# Patient Record
Sex: Female | Born: 1935 | Race: White | Hispanic: No | Marital: Married | Smoking: Former smoker
Health system: Southern US, Community
[De-identification: ages and names within clinical notes are randomized; demographics above are authoritative.]

## PROBLEM LIST (undated history)

## (undated) DIAGNOSIS — I10 Essential (primary) hypertension: Secondary | ICD-10-CM

## (undated) DIAGNOSIS — E785 Hyperlipidemia, unspecified: Secondary | ICD-10-CM

## (undated) DIAGNOSIS — K635 Polyp of colon: Secondary | ICD-10-CM

## (undated) DIAGNOSIS — G4700 Insomnia, unspecified: Secondary | ICD-10-CM

## (undated) DIAGNOSIS — E039 Hypothyroidism, unspecified: Secondary | ICD-10-CM

## (undated) DIAGNOSIS — H9190 Unspecified hearing loss, unspecified ear: Secondary | ICD-10-CM

## (undated) DIAGNOSIS — F0390 Unspecified dementia without behavioral disturbance: Secondary | ICD-10-CM

## (undated) HISTORY — DX: Hypothyroidism, unspecified: E03.9

## (undated) HISTORY — DX: Polyp of colon: K63.5

## (undated) HISTORY — DX: Essential (primary) hypertension: I10

## (undated) HISTORY — DX: Unspecified hearing loss, unspecified ear: H91.90

## (undated) HISTORY — DX: Insomnia, unspecified: G47.00

## (undated) HISTORY — DX: Unspecified dementia, unspecified severity, without behavioral disturbance, psychotic disturbance, mood disturbance, and anxiety: F03.90

## (undated) HISTORY — DX: Hyperlipidemia, unspecified: E78.5

## (undated) HISTORY — PX: ANKLE FRACTURE SURGERY: SHX122

---

## 2004-05-06 ENCOUNTER — Ambulatory Visit: Payer: Self-pay | Admitting: Internal Medicine

## 2004-05-08 ENCOUNTER — Ambulatory Visit: Payer: Self-pay | Admitting: Family Medicine

## 2004-05-08 ENCOUNTER — Encounter: Payer: Self-pay | Admitting: Internal Medicine

## 2004-06-04 ENCOUNTER — Ambulatory Visit (HOSPITAL_COMMUNITY): Admission: RE | Admit: 2004-06-04 | Discharge: 2004-06-04 | Payer: Self-pay | Admitting: Internal Medicine

## 2004-06-17 ENCOUNTER — Ambulatory Visit: Payer: Self-pay | Admitting: Internal Medicine

## 2004-12-15 ENCOUNTER — Ambulatory Visit: Payer: Self-pay | Admitting: Internal Medicine

## 2005-05-11 ENCOUNTER — Ambulatory Visit: Payer: Self-pay | Admitting: Internal Medicine

## 2005-05-11 ENCOUNTER — Encounter: Payer: Self-pay | Admitting: Internal Medicine

## 2005-05-11 ENCOUNTER — Other Ambulatory Visit: Admission: RE | Admit: 2005-05-11 | Discharge: 2005-05-11 | Payer: Self-pay | Admitting: Internal Medicine

## 2005-06-22 ENCOUNTER — Ambulatory Visit: Payer: Self-pay | Admitting: Internal Medicine

## 2005-06-24 ENCOUNTER — Ambulatory Visit: Payer: Self-pay | Admitting: Internal Medicine

## 2005-07-24 ENCOUNTER — Ambulatory Visit (HOSPITAL_COMMUNITY): Admission: RE | Admit: 2005-07-24 | Discharge: 2005-07-24 | Payer: Self-pay | Admitting: Internal Medicine

## 2005-08-20 ENCOUNTER — Ambulatory Visit: Payer: Self-pay | Admitting: Internal Medicine

## 2006-02-23 ENCOUNTER — Ambulatory Visit: Payer: Self-pay | Admitting: Internal Medicine

## 2006-02-23 DIAGNOSIS — M810 Age-related osteoporosis without current pathological fracture: Secondary | ICD-10-CM | POA: Insufficient documentation

## 2006-02-23 DIAGNOSIS — E039 Hypothyroidism, unspecified: Secondary | ICD-10-CM | POA: Insufficient documentation

## 2006-02-23 DIAGNOSIS — E785 Hyperlipidemia, unspecified: Secondary | ICD-10-CM | POA: Insufficient documentation

## 2006-02-23 DIAGNOSIS — Z860100 Personal history of colon polyps, unspecified: Secondary | ICD-10-CM | POA: Insufficient documentation

## 2006-02-23 DIAGNOSIS — I10 Essential (primary) hypertension: Secondary | ICD-10-CM | POA: Insufficient documentation

## 2006-02-23 LAB — CONVERTED CEMR LAB
BUN: 11 mg/dL (ref 6–23)
CO2: 28 meq/L (ref 19–32)
Calcium: 9.4 mg/dL (ref 8.4–10.5)
Chloride: 102 meq/L (ref 96–112)
Creatinine, Ser: 0.9 mg/dL (ref 0.4–1.2)
Folate: 13.8 ng/mL
GFR calc non Af Amer: 66 mL/min
Glomerular Filtration Rate, Af Am: 79 mL/min/{1.73_m2}
Glucose, Bld: 108 mg/dL — ABNORMAL HIGH (ref 70–99)
Potassium: 3.8 meq/L (ref 3.5–5.1)
Sodium: 138 meq/L (ref 135–145)
TSH: 3.32 microintl units/mL (ref 0.35–5.50)
Vitamin B-12: 594 pg/mL (ref 211–911)

## 2006-03-02 ENCOUNTER — Ambulatory Visit: Payer: Self-pay | Admitting: *Deleted

## 2006-07-01 ENCOUNTER — Ambulatory Visit: Payer: Self-pay | Admitting: Internal Medicine

## 2006-07-01 LAB — CONVERTED CEMR LAB
ALT: 25 units/L (ref 0–40)
AST: 31 units/L (ref 0–37)
Albumin: 3.8 g/dL (ref 3.5–5.2)
Alkaline Phosphatase: 62 units/L (ref 39–117)
BUN: 9 mg/dL (ref 6–23)
Basophils Absolute: 0 10*3/uL (ref 0.0–0.1)
Basophils Relative: 0.4 % (ref 0.0–1.0)
Bilirubin, Direct: 0.1 mg/dL (ref 0.0–0.3)
CO2: 27 meq/L (ref 19–32)
Calcium: 9.2 mg/dL (ref 8.4–10.5)
Chloride: 105 meq/L (ref 96–112)
Cholesterol: 250 mg/dL (ref 0–200)
Creatinine, Ser: 0.8 mg/dL (ref 0.4–1.2)
Direct LDL: 150 mg/dL
Eosinophils Absolute: 0.2 10*3/uL (ref 0.0–0.6)
Eosinophils Relative: 3 % (ref 0.0–5.0)
GFR calc Af Amer: 91 mL/min
GFR calc non Af Amer: 75 mL/min
Glucose, Bld: 84 mg/dL (ref 70–99)
HCT: 45 % (ref 36.0–46.0)
HDL: 71.8 mg/dL (ref >39.0)
Hemoglobin: 15 g/dL (ref 12.0–15.0)
Lymphocytes Relative: 22.7 % (ref 12.0–46.0)
MCHC: 33.2 g/dL (ref 30.0–36.0)
MCV: 88.3 fL (ref 78.0–100.0)
Monocytes Absolute: 0.7 10*3/uL (ref 0.2–0.7)
Monocytes Relative: 11.9 % — ABNORMAL HIGH (ref 3.0–11.0)
Neutro Abs: 3.6 10*3/uL (ref 1.4–7.7)
Neutrophils Relative %: 62 % (ref 43.0–77.0)
Platelets: 221 10*3/uL (ref 150–400)
Potassium: 5.3 meq/L — ABNORMAL HIGH (ref 3.5–5.1)
RBC: 5.1 M/uL (ref 3.87–5.11)
RDW: 12.5 % (ref 11.5–14.6)
Sodium: 139 meq/L (ref 135–145)
TSH: 1.39 microintl units/mL (ref 0.35–5.50)
Total Bilirubin: 0.9 mg/dL (ref 0.3–1.2)
Total CHOL/HDL Ratio: 3.5
Total Protein: 6.9 g/dL (ref 6.0–8.3)
Triglycerides: 78 mg/dL (ref 0–149)
VLDL: 16 mg/dL (ref 0–40)
WBC: 5.8 10*3/uL (ref 4.5–10.5)

## 2006-12-21 ENCOUNTER — Telehealth: Payer: Self-pay | Admitting: *Deleted

## 2007-03-03 ENCOUNTER — Telehealth: Payer: Self-pay | Admitting: Internal Medicine

## 2007-05-19 ENCOUNTER — Ambulatory Visit (HOSPITAL_COMMUNITY): Admission: RE | Admit: 2007-05-19 | Discharge: 2007-05-19 | Payer: Self-pay | Admitting: Internal Medicine

## 2007-07-05 ENCOUNTER — Ambulatory Visit: Payer: Self-pay | Admitting: Internal Medicine

## 2007-07-05 LAB — CONVERTED CEMR LAB
ALT: 30 units/L (ref 0–35)
AST: 32 units/L (ref 0–37)
Basophils Relative: 0.6 % (ref 0.0–1.0)
Bilirubin, Direct: 0.2 mg/dL (ref 0.0–0.3)
CO2: 29 meq/L (ref 19–32)
Calcium: 9.2 mg/dL (ref 8.4–10.5)
Eosinophils Relative: 4.2 % (ref 0.0–5.0)
GFR calc Af Amer: 79 mL/min
Glucose, Bld: 97 mg/dL (ref 70–99)
HCT: 45.1 % (ref 36.0–46.0)
HDL: 68.9 mg/dL (ref >39.0)
Hemoglobin: 14.8 g/dL (ref 12.0–15.0)
Lymphocytes Relative: 23.2 % (ref 12.0–46.0)
Neutro Abs: 3.4 10*3/uL (ref 1.4–7.7)
Platelets: 192 10*3/uL (ref 150–400)
Total Protein: 7.3 g/dL (ref 6.0–8.3)
VLDL: 19 mg/dL (ref 0–40)
WBC: 5.5 10*3/uL (ref 4.5–10.5)

## 2007-10-25 ENCOUNTER — Ambulatory Visit: Payer: Self-pay | Admitting: Internal Medicine

## 2008-04-10 ENCOUNTER — Ambulatory Visit: Payer: Self-pay | Admitting: Internal Medicine

## 2008-04-12 LAB — CONVERTED CEMR LAB
BUN: 12 mg/dL (ref 6–23)
Calcium: 9.6 mg/dL (ref 8.4–10.5)
Creatinine, Ser: 0.8 mg/dL (ref 0.4–1.2)
GFR calc Af Amer: 90 mL/min
Glucose, Bld: 90 mg/dL (ref 70–99)

## 2008-11-01 ENCOUNTER — Ambulatory Visit: Payer: Self-pay | Admitting: Internal Medicine

## 2008-11-01 DIAGNOSIS — F068 Other specified mental disorders due to known physiological condition: Secondary | ICD-10-CM | POA: Insufficient documentation

## 2009-05-10 ENCOUNTER — Ambulatory Visit: Payer: Self-pay | Admitting: Internal Medicine

## 2009-05-14 LAB — CONVERTED CEMR LAB
BUN: 11 mg/dL
CO2: 21 meq/L
Calcium: 9.1 mg/dL
Chloride: 104 meq/L
Creatinine, Ser: 0.71 mg/dL
Glucose, Bld: 92 mg/dL
Potassium: 4.1 meq/L
Sodium: 139 meq/L

## 2009-07-02 ENCOUNTER — Telehealth: Payer: Self-pay | Admitting: Internal Medicine

## 2009-08-19 ENCOUNTER — Encounter: Payer: Self-pay | Admitting: Internal Medicine

## 2009-11-05 ENCOUNTER — Ambulatory Visit: Payer: Self-pay | Admitting: Internal Medicine

## 2010-02-06 ENCOUNTER — Telehealth: Payer: Self-pay | Admitting: Internal Medicine

## 2010-05-08 ENCOUNTER — Other Ambulatory Visit: Payer: Self-pay | Admitting: Internal Medicine

## 2010-05-08 ENCOUNTER — Ambulatory Visit
Admission: RE | Admit: 2010-05-08 | Discharge: 2010-05-08 | Payer: Self-pay | Source: Home / Self Care | Attending: Internal Medicine | Admitting: Internal Medicine

## 2010-05-08 LAB — CBC WITH DIFFERENTIAL/PLATELET
Basophils Absolute: 0.1 10*3/uL (ref 0.0–0.1)
Basophils Relative: 0.7 % (ref 0.0–3.0)
Eosinophils Absolute: 0.3 10*3/uL (ref 0.0–0.7)
Eosinophils Relative: 3.4 % (ref 0.0–5.0)
HCT: 45.1 % (ref 36.0–46.0)
Hemoglobin: 15.3 g/dL — ABNORMAL HIGH (ref 12.0–15.0)
Lymphocytes Relative: 21.9 % (ref 12.0–46.0)
Lymphs Abs: 1.7 10*3/uL (ref 0.7–4.0)
MCHC: 33.9 g/dL (ref 30.0–36.0)
MCV: 89.3 fl (ref 78.0–100.0)
Monocytes Absolute: 0.7 10*3/uL (ref 0.1–1.0)
Monocytes Relative: 8.6 % (ref 3.0–12.0)
Neutro Abs: 5.1 10*3/uL (ref 1.4–7.7)
Neutrophils Relative %: 65.4 % (ref 43.0–77.0)
Platelets: 220 10*3/uL (ref 150.0–400.0)
RBC: 5.05 Mil/uL (ref 3.87–5.11)
RDW: 14.4 % (ref 11.5–14.6)
WBC: 7.9 10*3/uL (ref 4.5–10.5)

## 2010-05-08 LAB — BASIC METABOLIC PANEL
BUN: 11 mg/dL (ref 6–23)
CO2: 28 mEq/L (ref 19–32)
Calcium: 8.9 mg/dL (ref 8.4–10.5)
Chloride: 96 mEq/L (ref 96–112)
Creatinine, Ser: 0.7 mg/dL (ref 0.4–1.2)
GFR: 81.07 mL/min (ref >60.00)
Glucose, Bld: 82 mg/dL (ref 70–99)
Potassium: 4.3 mEq/L (ref 3.5–5.1)
Sodium: 140 mEq/L (ref 135–145)

## 2010-05-08 LAB — LDL CHOLESTEROL, DIRECT: Direct LDL: 169.6 mg/dL

## 2010-05-08 LAB — HEPATIC FUNCTION PANEL
ALT: 19 U/L (ref 0–35)
AST: 25 U/L (ref 0–37)
Albumin: 3.9 g/dL (ref 3.5–5.2)
Alkaline Phosphatase: 70 U/L (ref 39–117)
Bilirubin, Direct: 0.1 mg/dL (ref 0.0–0.3)
Total Bilirubin: 0.7 mg/dL (ref 0.3–1.2)
Total Protein: 7.1 g/dL (ref 6.0–8.3)

## 2010-05-08 LAB — LIPID PANEL
Cholesterol: 253 mg/dL — ABNORMAL HIGH (ref 0–200)
HDL: 73.1 mg/dL (ref >39.00)
Total CHOL/HDL Ratio: 3
Triglycerides: 106 mg/dL (ref 0.0–149.0)
VLDL: 21.2 mg/dL (ref 0.0–40.0)

## 2010-05-08 LAB — TSH: TSH: 28.56 u[IU]/mL — ABNORMAL HIGH (ref 0.35–5.50)

## 2010-05-27 NOTE — Therapy (Signed)
Summary: [facility]  [facility]   Imported By: Maryln Gottron 08/29/2009 15:37:39  _____________________________________________________________________  External Attachment:    Type:   Image     Comment:   External Document

## 2010-05-27 NOTE — Assessment & Plan Note (Signed)
Summary: fup//ccm   Vital Signs:  Patient profile:   77 year old female Height:      59.5 inches (151.13 cm) Weight:      140 pounds (63.64 kg) BMI:     27.90 Temp:     97.9 degrees F (36.61 degrees C) oral Pulse rate:   52 / minute BP sitting:   112 / 80  (left arm) Cuff size:   regular  Vitals Entered By: Josph Macho RMA (November 05, 2009 10:43 AM)  Nutrition Counseling: Patient's BMI is greater than 25 and therefore counseled on weight management options. CC: follow-up visit/ CF Is Patient Diabetic? No   CC:  follow-up visit/ CF.  History of Present Illness:  Follow-Up Visit      This is a 77 year old woman who presents for Follow-up visit.  The patient denies chest pain and palpitations.  Since the last visit the patient notes no new problems or concerns.  The patient reports taking meds as prescribed.  When questioned about possible medication side effects, the patient notes none.    All other systems reviewed and were negative   Current Problems (verified): 1)  Dementia  (ICD-294.8) 2)  Osteoporosis  (ICD-733.00) 3)  Hypothyroidism  (ICD-244.9) 4)  Hypertension  (ICD-401.9) 5)  Hyperlipidemia  (ICD-272.4) 6)  Colonic Polyps, Hx of  (ICD-V12.72)  Current Medications (verified): 1)  Hydrochlorothiazide 25 Mg Tabs (Hydrochlorothiazide) .... Take 1 Tablet By Mouth Once A Day 2)  Synthroid 100 Mcg Tabs (Levothyroxine Sodium) .... Take 1 Tablet By Mouth Once A Day 3)  Aricept 10 Mg  Tabs (Donepezil Hcl) .... Take 1 Tablet By Mouth Once A Day 4)  Namenda 10 Mg Tabs (Memantine Hcl) .Marland Kitchen.. 1 By Mouth Two Times A Day 5)  Aleve 220 Mg Tabs (Naproxen Sodium) .... As Needed  Allergies (verified): 1)  Amoxicillin (Amoxicillin)  Past History:  Past Medical History: Last updated: 02/23/2006 Colonic polyps, hx of Hyperlipidemia Hypertension Hypothyroidism (some question of Grave's disease) Osteoporosis  Past Surgical History: Last updated: 02/23/2006 ankle  fracture  Family History: Last updated: 06/16/2007 mother with alzheimers father died age 93 unknown cause brother died age 21 unknown cause  Social History: Last updated: 06/16/2007 Married Alcohol use-no 2 children  Risk Factors: Smoking Status: quit > 6 months (05/10/2009)  Review of Systems       All other systems reviewed and were negative   Physical Exam  General:  Well-developed,well-nourished,in no acute distress; alert,appropriate and cooperative throughout examination Head:  normocephalic and atraumatic.   Eyes:  pupils equal and pupils round.   Ears:  hearing aids Neck:  No deformities, masses, or tenderness noted. Chest Wall:  No deformities, masses, or tenderness noted. Lungs:  normal respiratory effort and no intercostal retractions.   Heart:  normal rate and regular rhythm.   Abdomen:  Bowel sounds positive,abdomen soft and non-tender without masses, organomegaly or hernias noted. Msk:  No deformity or scoliosis noted of thoracic or lumbar spine.   Neurologic:  cranial nerves II-XII intact and gait normal.     Impression & Recommendations:  Problem # 1:  DEMENTIA (ICD-294.8) tolerating meds without difficulty continue same discussed with husband pt still able to do all of ADLs  Problem # 2:  HYPOTHYROIDISM (ICD-244.9) controlled continue current medications  Her updated medication list for this problem includes:    Synthroid 100 Mcg Tabs (Levothyroxine sodium) .Marland Kitchen... Take 1 tablet by mouth once a day  Labs Reviewed: TSH: 1.22 (05/10/2009)  Chol: 247 (07/05/2007)   HDL: 68.9 (07/05/2007)   LDL: DEL (07/05/2007)   TG: 95 (07/05/2007)  Problem # 3:  HYPERLIPIDEMIA (ICD-272.4) no need for med see hdl Labs Reviewed: SGOT: 32 (07/05/2007)   SGPT: 30 (07/05/2007)   HDL:68.9 (07/05/2007), 71.8 (07/01/2006)  LDL:DEL (07/05/2007), DEL (07/01/2006)  Chol:247 (07/05/2007), 250 (07/01/2006)  Trig:95 (07/05/2007), 78 (07/01/2006)  Problem # 4:   HYPERTENSION (ICD-401.9) controlled continue current medications  Her updated medication list for this problem includes:    Hydrochlorothiazide 25 Mg Tabs (Hydrochlorothiazide) .Marland Kitchen... Take 1 tablet by mouth once a day  BP today: 112/80 Prior BP: 136/80 (05/10/2009)  Labs Reviewed: K+: 4.1 (05/10/2009) Creat: : 0.71 (05/10/2009)   Chol: 247 (07/05/2007)   HDL: 68.9 (07/05/2007)   LDL: DEL (07/05/2007)   TG: 95 (07/05/2007)  Complete Medication List: 1)  Hydrochlorothiazide 25 Mg Tabs (Hydrochlorothiazide) .... Take 1 tablet by mouth once a day 2)  Synthroid 100 Mcg Tabs (Levothyroxine sodium) .... Take 1 tablet by mouth once a day 3)  Aricept 10 Mg Tabs (Donepezil hcl) .... Take 1 tablet by mouth once a day 4)  Namenda 10 Mg Tabs (Memantine hcl) .Marland Kitchen.. 1 by mouth two times a day 5)  Aleve 220 Mg Tabs (Naproxen sodium) .... As needed

## 2010-05-27 NOTE — Assessment & Plan Note (Signed)
Summary: 6 month follow up/pt fasting/cjr/pt rsc from bmp/cjr   Vital Signs:  Patient profile:   77 year old female Weight:      142 pounds Temp:     98 degrees F Pulse rate:   52 / minute Resp:     12 per minute BP sitting:   136 / 80  (left arm)  Vitals Entered By: Gladis Riffle, RN (May 10, 2009 12:25 PM)   History of Present Illness:  Follow-Up Visit      This is a 77 year old woman who presents for Follow-up visit.  The patient denies chest pain, palpitations, dizziness, syncope, edema, SOB, DOE, PND, and orthopnea.  Since the last visit the patient notes no new problems or concerns.  The patient reports taking meds as prescribed.  When questioned about possible medication side effects, the patient notes none.  Husband present and states that pt's memory continues to decline  All other systems reviewed and were negative   Preventive Screening-Counseling & Management  Alcohol-Tobacco     Smoking Status: quit > 6 months     Year Quit: 1990  Current Problems (verified): 1)  Dementia  (ICD-294.8) 2)  Vaccine Against Influenza  (ICD-V04.81) 3)  Symptom, Memory Loss  (ICD-780.93) 4)  Osteoporosis  (ICD-733.00) 5)  Hypothyroidism  (ICD-244.9) 6)  Hypertension  (ICD-401.9) 7)  Hyperlipidemia  (ICD-272.4) 8)  Colonic Polyps, Hx of  (ICD-V12.72)  Current Medications (verified): 1)  Hydrochlorothiazide 25 Mg Tabs (Hydrochlorothiazide) .... Take 1 Tablet By Mouth Once A Day 2)  Synthroid 100 Mcg Tabs (Levothyroxine Sodium) .... Take 1 Tablet By Mouth Once A Day 3)  Aricept 10 Mg  Tabs (Donepezil Hcl) .... Take 1 Tablet By Mouth Once A Day 4)  Namenda 10 Mg Tabs (Memantine Hcl) .Marland Kitchen.. 1 By Mouth Two Times A Day 5)  Aleve 220 Mg Tabs (Naproxen Sodium) .... As Needed  Allergies: 1)  Amoxicillin (Amoxicillin)  Comments:  Nurse/Medical Assistant: 6 month rov  The patient's medications were reviewed with the patient's caretaker and were updated in the Medication and Allergy  Lists. Gladis Riffle, RN (May 10, 2009 12:26 PM)  Flu Vaccine Consent Questions     Do you have a history of severe allergic reactions to this vaccine? no    Any prior history of allergic reactions to egg and/or gelatin? no    Do you have a sensitivity to the preservative Thimersol? no    Do you have a past history of Guillan-Barre Syndrome? no    Do you currently have an acute febrile illness? no    Have you ever had a severe reaction to latex? no    Vaccine information given and explained to patient? yes    Are you currently pregnant? no    Lot Number:[id number]   Exp Date:10/24/2009   Site Given  Left Deltoid IM   Past History:  Past Medical History: Last updated: 02/23/2006 Colonic polyps, hx of Hyperlipidemia Hypertension Hypothyroidism (some question of Grave's disease) Osteoporosis  Past Surgical History: Last updated: 02/23/2006 ankle fracture  Family History: Last updated: 06/16/2007 mother with alzheimers father died age 41 unknown cause brother died age 25 unknown cause  Social History: Last updated: 06/16/2007 Married Alcohol use-no 2 children  Risk Factors: Smoking Status: quit > 6 months (05/10/2009)  Social History: Smoking Status:  quit > 6 months  Review of Systems       All other systems reviewed and were negative   Physical Exam  General:  Well-developed,well-nourished,in no acute distress; alert,appropriate and cooperative throughout examination Head:  normocephalic and atraumatic.   Eyes:  pupils equal and pupils round.   Ears:  R ear normal and L ear normal.   Neck:  No deformities, masses, or tenderness noted. Chest Wall:  No deformities, masses, or tenderness noted. Lungs:  normal respiratory effort and no intercostal retractions.   Abdomen:  Bowel sounds positive,abdomen soft and non-tender without masses, organomegaly or hernias noted. Msk:  No deformity or scoliosis noted of thoracic or lumbar spine.   Neurologic:  cranial  nerves II-XII intact and gait normal.   Skin:  turgor normal and color normal.     Impression & Recommendations:  Problem # 1:  DEMENTIA (ICD-294.8)  tolerating meds  Orders: Prescription Created Electronically [phone])  Problem # 2:  OSTEOPOROSIS (ICD-733.00)  Problem # 3:  HYPOTHYROIDISM (ICD-244.9)  Her updated medication list for this problem includes:    Synthroid 100 Mcg Tabs (Levothyroxine sodium) .Marland Kitchen... Take 1 tablet by mouth once a day  Labs Reviewed: TSH: 4.76 (04/10/2008)    Chol: 247 (07/05/2007)   HDL: 68.9 (07/05/2007)   LDL: DEL (07/05/2007)   TG: 95 (07/05/2007)  Orders: Venipuncture (95621) TLB-TSH (Thyroid Stimulating Hormone) (84443-TSH)  Problem # 4:  HYPERLIPIDEMIA (ICD-272.4)  Labs Reviewed: SGOT: 32 (07/05/2007)   SGPT: 30 (07/05/2007)   HDL:68.9 (07/05/2007), 71.8 (07/01/2006)  LDL:DEL (07/05/2007), DEL (07/01/2006)  Chol:247 (07/05/2007), 250 (07/01/2006)  Trig:95 (07/05/2007), 78 (07/01/2006)  Orders: Venipuncture (30865)  Problem # 5:  HYPERTENSION (ICD-401.9)  Her updated medication list for this problem includes:    Hydrochlorothiazide 25 Mg Tabs (Hydrochlorothiazide) .Marland Kitchen... Take 1 tablet by mouth once a day  BP today: 136/80 Prior BP: 142/82 (11/01/2008)  Labs Reviewed: K+: 4.5 (04/10/2008) Creat: : 0.8 (04/10/2008)   Chol: 247 (07/05/2007)   HDL: 68.9 (07/05/2007)   LDL: DEL (07/05/2007)   TG: 95 (07/05/2007)  Orders: Venipuncture (78469) T- * Misc. Laboratory test [phone])  Complete Medication List: 1)  Hydrochlorothiazide 25 Mg Tabs (Hydrochlorothiazide) .... Take 1 tablet by mouth once a day 2)  Synthroid 100 Mcg Tabs (Levothyroxine sodium) .... Take 1 tablet by mouth once a day 3)  Aricept 10 Mg Tabs (Donepezil hcl) .... Take 1 tablet by mouth once a day 4)  Namenda 10 Mg Tabs (Memantine hcl) .Marland Kitchen.. 1 by mouth two times a day 5)  Aleve 220 Mg Tabs (Naproxen sodium) .... As needed  Other Orders: Flu Vaccine 61yrs +  (84132) Administration Flu vaccine - MCR (G4010)  Patient Instructions: 1)  Please schedule a follow-up appointment in 6 months.

## 2010-05-27 NOTE — Progress Notes (Signed)
Summary: refill--aricept  Phone Note Refill Request Message from:  Fax from [facility] on February 06, 2010 12:51 PM  Refills Requested: Medication #1:  ARICEPT 10 MG  TABS Take 1 tablet by mouth once a day   Dosage confirmed as above?Dosage Confirmed   Supply Requested: 3 months   Last Refilled: 01/16/2009 Next Appointment Scheduled: 05-08-09 Dr Cato Mulligan Initial call taken by: Mervin Kung CMA Duncan Dull),  February 06, 2010 12:52 PM    Prescriptions: ARICEPT 10 MG  TABS (DONEPEZIL HCL) Take 1 tablet by mouth once a day  #90 x 3   Entered by:   Mervin Kung CMA (AAMA)   Authorized by:   Birdie Sons MD   Signed by:   Mervin Kung CMA (AAMA) on 02/06/2010   Method used:   Electronically to        [facility]       [address]       [place], [place]  [address]       Ph: [phone]       Fax: [phone]   RxID:   [id number]

## 2010-05-27 NOTE — Progress Notes (Signed)
Summary: REFILL synthroid  Phone Note Refill Request Message from:  Fax from Pharmacy  Refills Requested: Medication #1:  SYNTHROID 100 MCG TABS Take 1 tablet by mouth once a day [facility]---[phone]  Initial call taken by: Warnell Forester,  July 02, 2009 9:11 AM    Prescriptions: SYNTHROID 100 MCG TABS (LEVOTHYROXINE SODIUM) Take 1 tablet by mouth once a day  #90 x 3   Entered by:   Gladis Riffle, RN   Authorized by:   Birdie Sons MD   Signed by:   Gladis Riffle, RN on 07/02/2009   Method used:   Electronically to        [facility]       [address]       [place], [place]  [address]       Ph: [phone]       Fax: [phone]   RxID:   [id number]

## 2010-05-27 NOTE — Assessment & Plan Note (Signed)
° °  History of Present Illness:       The patient comes in today for a Follow-up visit.  The patient denies chest pain, palpitations, dizziness, syncope, low blood sugar symptoms, high blood sugar symptoms, edema, SOB, DOE, PND, and orthopnea.  Since the last visit patient notes no new problems or concerns.  The patient admits to taking medications as prescribed and monitoring BP.  When questioned about medication side effects, patient notes none.   Here to followup htn, hypothyroid, lipids...  Husband concerned with memory loss. She tends to ask questions repetitively. Can get lost driving  She has chronic hearing loss  Past Medical History:    Reviewed history from 02/23/2006 and no changes required:       Colonic polyps, hx of       Hyperlipidemia       Hypertension       Hypothyroidism (some question of Grave's disease)       Osteoporosis  Past Surgical History:    Reviewed history from 02/23/2006 and no changes required:       ankle fracture  Family History:    mother with alzheimers  Social History:    Married    Alcohol use-no  Risk Factors:  Alcohol use:  no  Review of Systems      See HPI  General      Denies fatigue and fever.  Resp      Denies chest discomfort and chest pain with inspiration.  GI      Denies abdominal pain and constipation.  GU      Denies dysuria and hematuria.  MS      Denies joint pain and joint redness.  Derm      Denies dryness and excessive perspiration.  Neuro      See HPI      Denies tremors and weakness.   Physical Exam  General:     Well-developed,well-nourished,in no acute distress; alert,appropriate and cooperative throughout examination  See vitals and medications Head:     Normocephalic and atraumatic without obvious abnormalities. No apparent alopecia or balding. Mouth:     Oral mucosa and oropharynx without lesions or exudates.  Teeth in good repair. Neck:     No deformities, masses, or tenderness  noted. Heart:     Normal rate and regular rhythm. S1 and S2 normal without gallop, murmur, click, rub or other extra sounds. Abdomen:     Bowel sounds positive,abdomen soft and non-tender without masses, organomegaly or hernias noted. Msk:     No deformity or scoliosis noted of thoracic or lumbar spine.   Neurologic:     No cranial nerve deficits noted. Station and gait are normal. Plantar reflexes are down-going bilaterally. DTRs are symmetrical throughout. Sensory, motor and coordinative functions appear intact.  Able to draw a clock Slow to respond to month and day of week---Looked at calendar Psych:     Cognition and judgment appear intact. Alert and cooperative with normal attention span and concentration. No apparent delusions, illusions, hallucinations   Impression & Recommendations:  Problem # 1:  SYMPTOM, MEMORY LOSS (ICD-780.93) tsh, b12,floate ct head with contrast---bmet Orders: CT Head/Brain w/o & w/Dye (40981XB)   Problem # 2:  OSTEOPOROSIS (ICD-733.00) continue meds---see sheet  Problem # 3:  HYPOTHYROIDISM (ICD-244.9) check tsh  Problem # 4:  HYPERTENSION (ICD-401.9) adequate control  Problem # 5:  VACCINE AGAINST INFLUENZA (ICD-V04.81)  Orders: Administration Flu vaccine (J4782)

## 2010-05-29 NOTE — Assessment & Plan Note (Signed)
Summary: 6 MONTH PT WILL COME IN FASTING/NJR   Vital Signs:  Patient profile:   77 year old female Weight:      143 pounds Temp:     98.0 degrees F oral Pulse rate:   64 / minute Pulse rhythm:   regular BP sitting:   122 / 80  (left arm) Cuff size:   regular  Vitals Entered By: Alfred Levins, CMA (May 08, 2010 8:25 AM)  History of Present Illness:  Follow-Up Visit: here with husband      This is a 66 year old woman who presents for Follow-up visit.  The patient denies chest pain and palpitations.  Since the last visit the patient notes no new problems or concerns.  The patient reports taking meds as prescribed.  When questioned about possible medication side effects, the patient notes none.  memory continues to decline per husband.   All other systems reviewed and were negative   Current Problems (verified): 1)  Dementia  (ICD-294.8) 2)  Osteoporosis  (ICD-733.00) 3)  Hypothyroidism  (ICD-244.9) 4)  Hypertension  (ICD-401.9) 5)  Hyperlipidemia  (ICD-272.4) 6)  Colonic Polyps, Hx of  (ICD-V12.72)  Current Medications (verified): 1)  Hydrochlorothiazide 25 Mg Tabs (Hydrochlorothiazide) .... Take 1 Tablet By Mouth Once A Day 2)  Synthroid 100 Mcg Tabs (Levothyroxine Sodium) .... Take 1 Tablet By Mouth Once A Day 3)  Aricept 10 Mg  Tabs (Donepezil Hcl) .... Take 1 Tablet By Mouth Once A Day 4)  Namenda 10 Mg Tabs (Memantine Hcl) .Marland Kitchen.. 1 By Mouth Two Times A Day 5)  Aleve 220 Mg Tabs (Naproxen Sodium) .... As Needed  Allergies (verified): 1)  Amoxicillin (Amoxicillin)  Past History:  Past Medical History: Last updated: 02/23/2006 Colonic polyps, hx of Hyperlipidemia Hypertension Hypothyroidism (some question of Grave's disease) Osteoporosis  Past Surgical History: Last updated: 02/23/2006 ankle fracture  Family History: Last updated: 06/16/2007 mother with alzheimers father died age 33 unknown cause brother died age 110 unknown cause  Social History: Last  updated: 06/16/2007 Married Alcohol use-no 2 children  Risk Factors: Smoking Status: quit > 6 months (05/10/2009)  Physical Exam  General:   well-developed female in no acute distress. HEENT exam atraumatic, normocephalic symmetric muscles are intact. Neck is supple without lymphadenopathy or thyromegaly. Chest clear to auscultation cardiac exam S1-S2 regular. Abdomen; soft and soft. Extremities no edema. Neurologic exam she is alert , intermittent confusion. Gait is normal.   Impression & Recommendations:  Problem # 1:  DEMENTIA (ICD-294.8) progressive dzs husband is supportive  Problem # 2:  HYPOTHYROIDISM (ICD-244.9) check labs today Her updated medication list for this problem includes:    Synthroid 100 Mcg Tabs (Levothyroxine sodium) .Marland Kitchen... Take 1 tablet by mouth once a day  Orders: Venipuncture ([address]) Specimen Handling (60454) TLB-TSH (Thyroid Stimulating Hormone) (84443-TSH)  Problem # 3:  HYPERTENSION (ICD-401.9) controlled continue current medications  Her updated medication list for this problem includes:    Hydrochlorothiazide 25 Mg Tabs (Hydrochlorothiazide) .Marland Kitchen... Take 1 tablet by mouth once a day  Orders: Venipuncture (09811) Specimen Handling (91478) TLB-BMP (Basic Metabolic Panel-BMET) (80048-METABOL) TLB-CBC Platelet - w/Differential (85025-CBCD)  BP today: 122/80 Prior BP: 112/80 (11/05/2009)  Labs Reviewed: K+: 4.1 (05/10/2009) Creat: : 0.71 (05/10/2009)   Chol: 247 (07/05/2007)   HDL: 68.9 (07/05/2007)   LDL: DEL (07/05/2007)   TG: 95 (07/05/2007)  Problem # 4:  HYPERLIPIDEMIA (ICD-272.4)  Orders: Venipuncture (29562) Specimen Handling (13086) TLB-Lipid Panel (80061-LIPID) TLB-Hepatic/Liver Function Pnl (80076-HEPATIC)  Complete Medication List:  1)  Hydrochlorothiazide 25 Mg Tabs (Hydrochlorothiazide) .... Take 1 tablet by mouth once a day 2)  Synthroid 100 Mcg Tabs (Levothyroxine sodium) .... Take 1 tablet by mouth once a day 3)  Aricept  10 Mg Tabs (Donepezil hcl) .... Take 1 tablet by mouth once a day 4)  Namenda 10 Mg Tabs (Memantine hcl) .Marland Kitchen.. 1 by mouth two times a day 5)  Aleve 220 Mg Tabs (Naproxen sodium) .... As needed  Patient Instructions: 1)  Please schedule a follow-up appointment in 6 months. Prescriptions: HYDROCHLOROTHIAZIDE 25 MG TABS (HYDROCHLOROTHIAZIDE) Take 1 tablet by mouth once a day  #90 x 3   Entered by:   Alfred Levins, CMA   Authorized by:   Birdie Sons MD   Signed by:   Alfred Levins, CMA on 05/08/2010   Method used:   Electronically to        [facility]       [address]       [place], [place]  [address]       Ph: [phone]       Fax: [phone]   RxID:   [id number]    Orders Added: 1)  Venipuncture [84132] 2)  Specimen Handling [99000] 3)  TLB-Lipid Panel [80061-LIPID] 4)  TLB-BMP (Basic Metabolic Panel-BMET) [80048-METABOL] 5)  TLB-CBC Platelet - w/Differential [85025-CBCD] 6)  TLB-Hepatic/Liver Function Pnl [80076-HEPATIC] 7)  TLB-TSH (Thyroid Stimulating Hormone) [44010-UVO]

## 2010-11-06 ENCOUNTER — Ambulatory Visit: Payer: Self-pay | Admitting: Internal Medicine

## 2010-11-20 ENCOUNTER — Ambulatory Visit: Payer: Self-pay | Admitting: Internal Medicine

## 2010-12-10 ENCOUNTER — Encounter: Payer: Self-pay | Admitting: Internal Medicine

## 2010-12-16 ENCOUNTER — Ambulatory Visit (INDEPENDENT_AMBULATORY_CARE_PROVIDER_SITE_OTHER): Payer: Medicare Other | Admitting: Internal Medicine

## 2010-12-16 ENCOUNTER — Encounter: Payer: Self-pay | Admitting: Internal Medicine

## 2010-12-16 DIAGNOSIS — I10 Essential (primary) hypertension: Secondary | ICD-10-CM

## 2010-12-16 DIAGNOSIS — E039 Hypothyroidism, unspecified: Secondary | ICD-10-CM

## 2010-12-16 DIAGNOSIS — F068 Other specified mental disorders due to known physiological condition: Secondary | ICD-10-CM

## 2010-12-16 MED ORDER — LISINOPRIL 10 MG PO TABS: 10.0000 mg | ORAL_TABLET | Freq: Every day | ORAL | Status: DC

## 2010-12-16 NOTE — Progress Notes (Signed)
  Subjective:    Patient ID: Veronica Morales, female    DOB: 1931-04-30, 77 y.o.   MRN: [id number]  HPI  HTN---leg cramps with HCTZ  Leg cramps  Hypothyroid---tolerating meds  Dementia---gradually progressive according to husband  Past Medical History  Diagnosis Date  . Colon polyps   . Hyperlipidemia   . Hypertension   . Hypothyroidism   . Osteoporosis    Past Surgical History  Procedure Date  . Ankle fracture surgery     reports that she quit smoking about 20 years ago. She does not have any smokeless tobacco history on file. She reports that she does not drink alcohol. Her drug history not on file. family history includes Dementia in her mother. Allergies  Allergen Reactions  . Amoxicillin     REACTION: unspecified     Review of Systems  patient denies chest pain, shortness of breath, orthopnea. Denies lower extremity edema, abdominal pain, change in appetite, change in bowel movements. Patient denies rashes, musculoskeletal complaints. No other specific complaints in a complete review of systems.      Objective:   Physical Exam   Well-developed well-nourished female in no acute distress. HEENT exam atraumatic, normocephalic, extraocular muscles are intact. Neck is supple. No jugular venous distention no thyromegaly. Chest clear to auscultation without increased work of breathing. Cardiac exam S1 and S2 are regular. Abdominal exam active bowel sounds, soft, nontender. Extremities no edema. Neurologic exam she is alert but confused without any motor sensory deficits. Gait is normal.       Assessment & Plan:

## 2010-12-24 NOTE — Assessment & Plan Note (Signed)
Progressive sxs Discussed meds Will continue the same meds for now

## 2010-12-24 NOTE — Assessment & Plan Note (Signed)
BP Readings from Last 3 Encounters:  12/16/10 126/78  05/08/10 122/80  11/05/09 112/80   Controlled Continue same meds

## 2010-12-24 NOTE — Assessment & Plan Note (Signed)
Tolerating meds Continue same 

## 2011-03-09 ENCOUNTER — Other Ambulatory Visit: Payer: Self-pay | Admitting: Internal Medicine

## 2011-06-19 ENCOUNTER — Ambulatory Visit (INDEPENDENT_AMBULATORY_CARE_PROVIDER_SITE_OTHER): Payer: Medicare Other | Admitting: Internal Medicine

## 2011-06-19 ENCOUNTER — Encounter: Payer: Self-pay | Admitting: Internal Medicine

## 2011-06-19 VITALS — BP 120/84 | HR 65 | Temp 97.6°F | Wt 145.0 lb

## 2011-06-19 DIAGNOSIS — F068 Other specified mental disorders due to known physiological condition: Secondary | ICD-10-CM

## 2011-06-19 DIAGNOSIS — E785 Hyperlipidemia, unspecified: Secondary | ICD-10-CM

## 2011-06-19 DIAGNOSIS — E039 Hypothyroidism, unspecified: Secondary | ICD-10-CM

## 2011-06-19 DIAGNOSIS — I10 Essential (primary) hypertension: Secondary | ICD-10-CM

## 2011-06-19 LAB — BASIC METABOLIC PANEL
BUN: 10 mg/dL (ref 6–23)
Calcium: 8.9 mg/dL (ref 8.4–10.5)
GFR: 82.11 mL/min (ref >60.00)
Glucose, Bld: 85 mg/dL (ref 70–99)

## 2011-06-19 NOTE — Assessment & Plan Note (Signed)
Continue current medications Rx are up to date

## 2011-06-19 NOTE — Assessment & Plan Note (Signed)
Well controlled. Check labs today. 

## 2011-06-19 NOTE — Assessment & Plan Note (Signed)
No need for treatment based on previous labs

## 2011-06-19 NOTE — Progress Notes (Signed)
  Subjective:    Patient ID: Veronica Morales, female    DOB: 09-Mar-1932, 77 y.o.   MRN: [id number]  HPI  Patient Active Problem List  Diagnoses  . HYPOTHYROIDISM--- Lab Results  Component Value Date   TSH 28.56* 05/08/2010   Needs f/u labs  . HYPERLIPIDEMIA--- Lab Results  Component Value Date   CHOL 253* 05/08/2010   HDL 73.10 05/08/2010   LDLDIRECT 169.6 05/08/2010   TRIG 106.0 05/08/2010   CHOLHDL 3 05/08/2010     . DEMENTIA---tolerating meds  . HYPERTENSION--no home bps, tolerating meds  .   Marland Kitchen    Past Medical History  Diagnosis Date  . Colon polyps   . Hyperlipidemia   . Hypertension   . Hypothyroidism   . Osteoporosis    Past Surgical History  Procedure Date  . Ankle fracture surgery     reports that she quit smoking about 20 years ago. She does not have any smokeless tobacco history on file. She reports that she does not drink alcohol. Her drug history not on file. family history includes Dementia in her mother. Allergies  Allergen Reactions  . Amoxicillin     REACTION: unspecified     Review of Systems  patient denies chest pain, shortness of breath, orthopnea. Denies lower extremity edema, abdominal pain, change in appetite, change in bowel movements. Patient denies rashes, musculoskeletal complaints. No other specific complaints in a complete review of systems.      Objective:   Physical Exam  Well-developed well-nourished female in no acute distress. HEENT exam atraumatic, normocephalic, extraocular muscles are intact. Neck is supple. No jugular venous distention no thyromegaly. Chest clear to auscultation without increased work of breathing. Cardiac exam S1 and S2 are regular. Abdominal exam active bowel sounds, soft, nontender. Extremities no edema. Neurologic exam she is alert without any motor sensory deficits. Gait is normal.        Assessment & Plan:

## 2011-06-19 NOTE — Assessment & Plan Note (Signed)
Check labs today.

## 2011-06-30 ENCOUNTER — Other Ambulatory Visit: Payer: Self-pay | Admitting: Internal Medicine

## 2011-12-24 ENCOUNTER — Ambulatory Visit: Payer: Medicare Other | Admitting: Internal Medicine

## 2011-12-25 ENCOUNTER — Ambulatory Visit: Payer: Medicare Other | Admitting: Internal Medicine

## 2012-01-19 ENCOUNTER — Ambulatory Visit (INDEPENDENT_AMBULATORY_CARE_PROVIDER_SITE_OTHER): Payer: Medicare Other | Admitting: Internal Medicine

## 2012-01-19 ENCOUNTER — Ambulatory Visit: Payer: Medicare Other | Admitting: Internal Medicine

## 2012-01-19 ENCOUNTER — Encounter: Payer: Self-pay | Admitting: Internal Medicine

## 2012-01-19 VITALS — BP 154/92 | HR 64 | Temp 98.0°F | Wt 140.0 lb

## 2012-01-19 DIAGNOSIS — F068 Other specified mental disorders due to known physiological condition: Secondary | ICD-10-CM

## 2012-01-19 DIAGNOSIS — Z23 Encounter for immunization: Secondary | ICD-10-CM

## 2012-01-19 NOTE — Assessment & Plan Note (Signed)
sxs are progressive She may be having sxs from meds Stop aricept and namenda  Discussed with patient's husband Discussed DNR- I have signed out of hospital arrest form

## 2012-01-19 NOTE — Patient Instructions (Addendum)
Call your insurance company and see if they will cover shingles vaccine and "tdap". If they will, call us and we will give it to you or we will send prescription to a pharmacy

## 2012-01-19 NOTE — Progress Notes (Signed)
Patient ID: Veronica Morales, female   DOB: 08-09-1931, 78 y.o.   MRN: [id number] Memory- she has progressive dementia. Husband reports some night time disturbances-- confusion, hallucinations.  Husband notes that she may favor right hip.  Past Medical History  Diagnosis Date  . Colon polyps   . Hyperlipidemia   . Hypertension   . Hypothyroidism   . Osteoporosis     History   Social History  . Marital Status: Married    Spouse Name: N/A    Number of Children: N/A  . Years of Education: N/A   Occupational History  . Not on file.   Social History Main Topics  . Smoking status: Former Smoker    Quit date: 12/16/1990  . Smokeless tobacco: Not on file  . Alcohol Use: No  . Drug Use: Not on file  . Sexually Active: Not on file   Other Topics Concern  . Not on file   Social History Narrative  . No narrative on file    Past Surgical History  Procedure Date  . Ankle fracture surgery     Family History  Problem Relation Age of Onset  . Dementia Mother     Allergies  Allergen Reactions  . Amoxicillin     REACTION: unspecified    Current Outpatient Prescriptions on File Prior to Visit  Medication Sig Dispense Refill  . donepezil (ARICEPT) 10 MG tablet Take 1 tablet by mouth daily.      . memantine (NAMENDA) 10 MG tablet Take 10 mg by mouth 2 (two) times daily.        Marland Kitchen SYNTHROID 100 MCG tablet TAKE 1 TABLET DAILY  90 tablet  3  . DISCONTD: lisinopril (PRINIVIL,ZESTRIL) 10 MG tablet Take 1 tablet (10 mg total) by mouth daily.  90 tablet  3     patient denies chest pain, shortness of breath, orthopnea. Denies lower extremity edema, abdominal pain, change in appetite, change in bowel movements. Patient denies rashes, musculoskeletal complaints. No other specific complaints in a complete review of systems.   BP 154/92  Pulse 64  Temp 98 F (36.7 C) (Oral)  Wt 140 lb (63.504 kg)  Well-developed well-nourished female in no acute distress. HEENT exam atraumatic,  normocephalic, extraocular muscles are intact. Neck is supple. No jugular venous distention no thyromegaly. Chest clear to auscultation without increased work of breathing. Cardiac exam S1 and S2 are regular. Abdominal exam active bowel sounds, soft, nontender. Extremities no edema. Neurologic exam she is alert without any motor sensory deficits.

## 2012-02-17 ENCOUNTER — Encounter: Payer: Self-pay | Admitting: Internal Medicine

## 2012-02-17 DIAGNOSIS — H6120 Impacted cerumen, unspecified ear: Secondary | ICD-10-CM

## 2012-02-18 NOTE — Addendum Note (Signed)
Addended by: Alfred Levins D on: 02/18/2012 11:38 AM   Modules accepted: Orders

## 2012-03-12 ENCOUNTER — Emergency Department (HOSPITAL_COMMUNITY)
Admission: EM | Admit: 2012-03-12 | Discharge: 2012-03-12 | Disposition: A | Discharge status: Home / Self Care | Payer: Medicare Other | Attending: Emergency Medicine | Admitting: Emergency Medicine

## 2012-03-12 ENCOUNTER — Emergency Department (HOSPITAL_COMMUNITY): Payer: Medicare Other

## 2012-03-12 ENCOUNTER — Encounter (HOSPITAL_COMMUNITY): Payer: Self-pay | Admitting: *Deleted

## 2012-03-12 DIAGNOSIS — F411 Generalized anxiety disorder: Secondary | ICD-10-CM | POA: Insufficient documentation

## 2012-03-12 DIAGNOSIS — E785 Hyperlipidemia, unspecified: Secondary | ICD-10-CM | POA: Insufficient documentation

## 2012-03-12 DIAGNOSIS — I10 Essential (primary) hypertension: Secondary | ICD-10-CM | POA: Insufficient documentation

## 2012-03-12 DIAGNOSIS — Z860100 Personal history of colon polyps, unspecified: Secondary | ICD-10-CM | POA: Insufficient documentation

## 2012-03-12 DIAGNOSIS — Z87891 Personal history of nicotine dependence: Secondary | ICD-10-CM | POA: Insufficient documentation

## 2012-03-12 DIAGNOSIS — E039 Hypothyroidism, unspecified: Secondary | ICD-10-CM | POA: Insufficient documentation

## 2012-03-12 DIAGNOSIS — Z79899 Other long term (current) drug therapy: Secondary | ICD-10-CM | POA: Insufficient documentation

## 2012-03-12 DIAGNOSIS — F0390 Unspecified dementia without behavioral disturbance: Secondary | ICD-10-CM | POA: Insufficient documentation

## 2012-03-12 DIAGNOSIS — M810 Age-related osteoporosis without current pathological fracture: Secondary | ICD-10-CM | POA: Insufficient documentation

## 2012-03-12 LAB — URINALYSIS, ROUTINE W REFLEX MICROSCOPIC
Bilirubin Urine: NEGATIVE
Nitrite: NEGATIVE
Specific Gravity, Urine: 1.02 (ref 1.005–1.030)
Urobilinogen, UA: 0.2 mg/dL (ref 0.0–1.0)
pH: 5.5 (ref 5.0–8.0)

## 2012-03-12 LAB — URINE MICROSCOPIC-ADD ON

## 2012-03-12 LAB — CBC
Hemoglobin: 13.4 g/dL (ref 12.0–15.0)
MCH: 28.2 pg (ref 26.0–34.0)
MCHC: 32.8 g/dL (ref 30.0–36.0)
MCV: 85.9 fL (ref 78.0–100.0)
Platelets: 233 10*3/uL (ref 150–400)

## 2012-03-12 LAB — COMPREHENSIVE METABOLIC PANEL
ALT: 10 U/L (ref 0–35)
Calcium: 9.1 mg/dL (ref 8.4–10.5)
Creatinine, Ser: 0.58 mg/dL (ref 0.50–1.10)
GFR calc Af Amer: >90 mL/min (ref >90)
Glucose, Bld: 102 mg/dL — ABNORMAL HIGH (ref 70–99)
Sodium: 136 mEq/L (ref 135–145)
Total Protein: 7.6 g/dL (ref 6.0–8.3)

## 2012-03-12 MED ORDER — TRAZODONE HCL 50 MG PO TABS
50.0000 mg | ORAL_TABLET | Freq: Once | ORAL | Status: AC
  Administered 2012-03-12: 50 mg via ORAL
  Filled 2012-03-12: qty 1

## 2012-03-12 MED ORDER — TRAZODONE HCL 50 MG PO TABS: 50.0000 mg | ORAL_TABLET | Freq: Every day | ORAL | Status: DC

## 2012-03-12 NOTE — ED Notes (Signed)
Pt has known dementia brought in by husband and daughter for increasing anxiety. Symptoms of anxiety and disorientation began last week while on a trip. Memory impairment is worse and she is wandering more as well. Pt has been at home from trip for 24 hours with no resolution or lessening of symptoms. Pt's husband called doctor and advised to try benadryl, but per husband, it "made things worse."

## 2012-03-12 NOTE — ED Notes (Signed)
Pt refusing to take medication. Will try again later

## 2012-03-12 NOTE — ED Notes (Signed)
Pt's daughter asking how much longer they will be here/plan of care.   EDP Pickering notified of family's request. Will be going to discuss plan with pt/family.

## 2012-03-12 NOTE — ED Notes (Signed)
Pt sitting in in chair.  Pt's family states that pt has slightly decreased in anxiety.

## 2012-03-12 NOTE — ED Notes (Signed)
MD at bedside. 

## 2012-03-12 NOTE — ED Notes (Signed)
Pt unable to give me a urine sample. Pt is running out the bathroom door and refuses to sit down. Pt gets agitated. Will attempt later.

## 2012-03-12 NOTE — ED Provider Notes (Signed)
History     CSN: [id number]  Arrival date & time 03/12/12  1357   First MD Initiated Contact with Patient 03/12/12 1528      Chief Complaint  Patient presents with  . Dementia  . Anxiety   level V caveat due to dementia  (Consider location/radiation/quality/duration/timing/severity/associated sxs/prior treatment) Patient is a 78 y.o. female presenting with anxiety. The history is provided by the patient and the spouse.  Anxiety   patient presents with worsening of her dementia. She's had a diagnosis for 5 years. She's recently gone to [place] with her family and has had worsening since then. She also been getting gradually worse over the last few months. Aricept had been stopped and did not improve symptoms. She was given Benadryl, but it made things worse. No trauma. No fevers.  Past Medical History  Diagnosis Date  . Colon polyps   . Hyperlipidemia   . Hypertension   . Hypothyroidism   . Osteoporosis     Past Surgical History  Procedure Date  . Ankle fracture surgery     Family History  Problem Relation Age of Onset  . Dementia Mother     History  Substance Use Topics  . Smoking status: Former Smoker    Quit date: 12/16/1990  . Smokeless tobacco: Never Used  . Alcohol Use: No    OB History    Grav Para Term Preterm Abortions TAB SAB Ect Mult Living                  Review of Systems  Unable to perform ROS: Dementia    Allergies  Amoxicillin and Penicillins  Home Medications   Current Outpatient Rx  Name  Route  Sig  Dispense  Refill  . LEVOTHYROXINE SODIUM 100 MCG PO TABS   Oral   Take 100 mcg by mouth daily.         Marland Kitchen LISINOPRIL 10 MG PO TABS   Oral   Take 10 mg by mouth daily.         Marland Kitchen NAPROXEN SODIUM 220 MG PO TABS   Oral   Take 220 mg by mouth 2 (two) times daily as needed. pain         . TRAZODONE HCL 50 MG PO TABS   Oral   Take 1 tablet (50 mg total) by mouth at bedtime. May repeat x1   14 tablet   0     BP 144/91   Pulse 94  Temp 98 F (36.7 C) (Oral)  Resp 14  SpO2 96%  Physical Exam  Constitutional: She appears well-developed.  HENT:  Head: Normocephalic.  Eyes: Pupils are equal, round, and reactive to light.  Neck: Neck supple.  Cardiovascular: Normal rate.   Pulmonary/Chest: Effort normal and breath sounds normal.  Abdominal: Soft.  Musculoskeletal: Normal range of motion.  Neurological: She is alert.       Patient is awake and will converse. She is somewhat confused and has difficulty recognizing her family  Skin: Skin is warm.    ED Course  Procedures (including critical care time)  Labs Reviewed  COMPREHENSIVE METABOLIC PANEL - Abnormal; Notable for the following:    Glucose, Bld 102 (*)     GFR calc non Af Amer 87 (*)     All other components within normal limits  URINALYSIS, ROUTINE W REFLEX MICROSCOPIC - Abnormal; Notable for the following:    APPearance CLOUDY (*)     Leukocytes, UA MODERATE (*)  All other components within normal limits  CBC  URINE MICROSCOPIC-ADD ON   Dg Chest Port 1 View  03/12/2012  *RADIOLOGY REPORT*  Clinical Data: Altered mental status.  PORTABLE CHEST - 1 VIEW  Comparison: No priors.  Findings: Lung volumes are low.  Probable subsegmental atelectasis in the left lower lobe.  Possible trace left pleural effusion. Right lung is clear.  Pulmonary vasculature is normal.  Heart size is borderline enlarged. The patient is rotated to the left on today's exam, resulting in distortion of the mediastinal contours and reduced diagnostic sensitivity and specificity for mediastinal pathology.  Atherosclerosis in the thoracic aorta.  IMPRESSION: 1.  Low lung volumes with probable left lower lobe subsegmental atelectasis.  There may also be a trace left pleural effusion. 2.  Atherosclerosis.   Original Report Authenticated By: Trudie Reed, M.D.      1. Dementia       MDM  Patient with worsening dementia. Has been out of town recently. Difficulty  managing at home. Patient has not been sleeping. Patient's had some coughing with trazodone 50 mg. She'll be given prescription for this and it may be repeated once. Family will call primary care doctor about possible placement. He feels comfortable with her going home at this time.        Juliet Rude. Rubin Payor, MD 03/12/12 1807

## 2012-03-13 ENCOUNTER — Encounter: Payer: Self-pay | Admitting: Internal Medicine

## 2012-03-21 ENCOUNTER — Other Ambulatory Visit: Payer: Self-pay | Admitting: Internal Medicine

## 2012-03-21 ENCOUNTER — Encounter: Payer: Self-pay | Admitting: Internal Medicine

## 2012-03-21 DIAGNOSIS — F0390 Unspecified dementia without behavioral disturbance: Secondary | ICD-10-CM

## 2012-03-23 ENCOUNTER — Encounter: Payer: Self-pay | Admitting: Internal Medicine

## 2012-03-25 ENCOUNTER — Other Ambulatory Visit: Payer: Self-pay

## 2012-03-25 MED ORDER — TRAZODONE HCL 50 MG PO TABS: 50.0000 mg | ORAL_TABLET | Freq: Every day | ORAL | Status: DC

## 2012-03-25 NOTE — Telephone Encounter (Signed)
Ok per Dr. Cato Mulligan to refill Trazodone for 90 x 3 rf.  Rx sent to pharmacy.

## 2012-04-01 ENCOUNTER — Other Ambulatory Visit: Payer: Self-pay | Admitting: Internal Medicine

## 2012-04-01 DIAGNOSIS — F068 Other specified mental disorders due to known physiological condition: Secondary | ICD-10-CM

## 2012-04-14 ENCOUNTER — Encounter: Payer: Self-pay | Admitting: Internal Medicine

## 2012-04-21 ENCOUNTER — Other Ambulatory Visit: Payer: Self-pay | Admitting: Internal Medicine

## 2012-05-26 ENCOUNTER — Encounter: Payer: Self-pay | Admitting: Internal Medicine

## 2012-05-27 MED ORDER — TRAZODONE HCL 50 MG PO TABS: 50.0000 mg | ORAL_TABLET | Freq: Every day | ORAL | Status: DC

## 2012-07-14 ENCOUNTER — Ambulatory Visit (INDEPENDENT_AMBULATORY_CARE_PROVIDER_SITE_OTHER): Payer: Medicare Other | Admitting: Neurology

## 2012-07-14 ENCOUNTER — Encounter: Payer: Self-pay | Admitting: Neurology

## 2012-07-14 VITALS — BP 110/66 | HR 68 | Ht 60.0 in | Wt 140.0 lb

## 2012-07-14 MED ORDER — TRAZODONE HCL 100 MG PO TABS: 100.0000 mg | ORAL_TABLET | Freq: Every day | ORAL | Status: DC

## 2012-07-14 NOTE — Progress Notes (Signed)
Reason for visit: Dementia  Veronica Morales is an 79 y.o. female  History of present illness:  Veronica Morales is a 79 year old right-handed white female with a history of a progressive memory disturbance that has been present since 2006. The patient originally was seen through this office in 2008, and she was on Aricept and Namenda at that time. The patient has essentially remained on this medication over the years, with a relatively slow progression of her memory deficits. The patient is still living at home with her husband, but she requires assistance for most activities of daily living. The patient is able to feed herself, but she needs assistance with bathing, and sometimes with dressing. The patient does not cook. The patient has no other significant interest in any activities of daily living. The patient will walk at times with the husband. The patient has not had any numbness or weakness of the extremities, no reports of headache, and no problems with balance. The patient has no problems with incontinence of the bowel or the bladder. The patient appears to have a lot of difficulty understanding verbal commands. The patient is sent to this office for an evaluation. The patient has had CT evaluation of the brain in the past that revealed evidence of atrophy. The patient is tolerating the Namenda and Aricept fairly well. The patient does have some difficulty with sleeping at night on occasion, but she is on trazodone at 50 mg at night which seems to help. The patient will have hallucinations on occasion.   ROS:  Out of a complete 14 system review of symptoms, the patient complains only of the following symptoms, and all other reviewed systems are negative.  Hearing loss Memory disturbance Confusion Insomnia Anxiety, depression Hallucinations Joint pain, knee   Blood pressure 110/66, pulse 68, height 5' (1.524 m), weight 140 lb (63.504 kg).  Physical Exam  General: The patient is alert, she is  somewhat agitated at the time of the examination.  Head: Pupils are equal, round, and reactive to light. Discs are flat bilaterally.  Neck: The neck is supple, no carotid bruits are noted.  Respiratory: The respiratory examination is clear.  Cardiovascular: The cardiovascular examination reveals a regular rate and rhythm, no obvious murmurs or rubs are noted.  Skin: Extremities are without significant edema.  Neurologic Exam  Mental status:   The MMSE was not done due to agitation on the part of the patient.  Cranial nerves: Facial symmetry is present. There is good sensation of the face to pinprick and soft touch bilaterally. The strength of the facial muscles and the muscles to head turning and shoulder shrug are normal bilaterally. Speech is well enunciated, no aphasia or dysarthria is noted. Extraocular movements are full. Visual fields are full.  Motor: The motor testing reveals 5 over 5 strength of all 4 extremities. Good symmetric motor tone is noted throughout.  Sensory: Sensory testing is intact to pinprick, soft touch, and vibration sensation on all four extremities.  Coordination: Cerebellar testing reveals good finger-nose-finger and heel-to-shin bilaterally. Some apraxia is seen.  Gait and station: Gait is normal. Tandem gait is slightly unsteady. Romberg is negative. No drift is seen  Reflexes: Deep tendon reflexes are symmetric and normal bilaterally. Toes are down going bilaterally.   Assessment/Plan:  One. Memory disturbance, Alzheimer's disease  The patient appears to have some degree of agitation and hallucinations associated with her memory disturbance. The patient may benefit from an increase in the trazodone dose to help her sleep more  consistently at night. As time goes on, the patient may not be able to be managed in the home environment. The patient will followup through this office in 6 or 7 months. If agitation remains a problem, Lexapro can be used during  the day.   Marlan Palau MD 07/14/2012 12:38 PM

## 2012-07-18 ENCOUNTER — Other Ambulatory Visit: Payer: Self-pay | Admitting: *Deleted

## 2012-07-18 ENCOUNTER — Encounter: Payer: Medicare Other | Admitting: Internal Medicine

## 2012-07-18 MED ORDER — LISINOPRIL 10 MG PO TABS: 10.0000 mg | ORAL_TABLET | Freq: Every day | ORAL | Status: DC

## 2012-07-29 ENCOUNTER — Ambulatory Visit: Payer: Medicare Other | Admitting: Internal Medicine

## 2012-08-08 ENCOUNTER — Encounter: Payer: Self-pay | Admitting: Internal Medicine

## 2012-08-08 ENCOUNTER — Ambulatory Visit (INDEPENDENT_AMBULATORY_CARE_PROVIDER_SITE_OTHER): Payer: Medicare Other | Admitting: Internal Medicine

## 2012-08-08 VITALS — BP 112/74 | HR 68 | Temp 97.6°F | Wt 137.0 lb

## 2012-08-08 DIAGNOSIS — E039 Hypothyroidism, unspecified: Secondary | ICD-10-CM

## 2012-08-08 DIAGNOSIS — I10 Essential (primary) hypertension: Secondary | ICD-10-CM

## 2012-08-08 DIAGNOSIS — E785 Hyperlipidemia, unspecified: Secondary | ICD-10-CM

## 2012-08-08 DIAGNOSIS — Z23 Encounter for immunization: Secondary | ICD-10-CM

## 2012-08-08 NOTE — Progress Notes (Signed)
Patient ID: Naly Schwanz, female   DOB: 03/30/1932, 79 y.o.   MRN: [id number] Dementia-- reviewed neurolgoy note Note increase trazodone-- has helped with sleep.  She is tolerating meds  htn-- tolerating meds  sleepp-- doing well with increased trazodone  Reviewed pmh, psh, sochx   patient denies chest pain, shortness of breath, orthopnea. Denies lower extremity edema, abdominal pain, change in appetite, change in bowel movements. Patient denies rashes, musculoskeletal complaints. No other specific complaints in a complete review of systems.    well-developed well-nourished female in no acute distress. HEENT exam atraumatic, normocephalic, neck supple without jugular venous distention. Chest clear to auscultation cardiac exam S1-S2 are regular. Abdominal exam overweight with bowel sounds, soft and nontender. Extremities no edema. Neurologic exam is alert with a normal gait.

## 2012-08-08 NOTE — Assessment & Plan Note (Signed)
Adequate control Continue meds 

## 2012-08-08 NOTE — Assessment & Plan Note (Signed)
No need for treatment

## 2012-08-08 NOTE — Assessment & Plan Note (Signed)
We will check next office visit

## 2012-08-08 NOTE — Addendum Note (Signed)
Addended by: Alfred Levins D on: 08/08/2012 09:44 AM   Modules accepted: Orders

## 2012-10-29 ENCOUNTER — Encounter: Payer: Self-pay | Admitting: Internal Medicine

## 2012-11-04 ENCOUNTER — Other Ambulatory Visit: Payer: Self-pay | Admitting: Internal Medicine

## 2012-11-14 ENCOUNTER — Other Ambulatory Visit: Payer: Self-pay | Admitting: *Deleted

## 2012-11-14 MED ORDER — DONEPEZIL HCL 10 MG PO TABS: 10.0000 mg | ORAL_TABLET | Freq: Every evening | ORAL | Status: DC | PRN

## 2013-01-06 ENCOUNTER — Encounter: Payer: Self-pay | Admitting: Internal Medicine

## 2013-01-17 ENCOUNTER — Ambulatory Visit (INDEPENDENT_AMBULATORY_CARE_PROVIDER_SITE_OTHER): Payer: Medicare Other | Admitting: Neurology

## 2013-01-17 ENCOUNTER — Encounter: Payer: Self-pay | Admitting: Neurology

## 2013-01-17 VITALS — Wt 134.0 lb

## 2013-01-17 DIAGNOSIS — F068 Other specified mental disorders due to known physiological condition: Secondary | ICD-10-CM

## 2013-01-17 MED ORDER — HALOPERIDOL 2 MG PO TABS: 2.0000 mg | ORAL_TABLET | Freq: Two times a day (BID) | ORAL | Status: DC
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
## 2013-01-25 ENCOUNTER — Encounter: Payer: Self-pay | Admitting: Internal Medicine

## 2013-01-25 ENCOUNTER — Ambulatory Visit (INDEPENDENT_AMBULATORY_CARE_PROVIDER_SITE_OTHER): Payer: Medicare Other | Admitting: Internal Medicine

## 2013-01-25 VITALS — BP 142/84 | HR 64 | Temp 97.7°F | Ht 60.0 in | Wt 131.0 lb

## 2013-01-25 DIAGNOSIS — Z23 Encounter for immunization: Secondary | ICD-10-CM

## 2013-01-25 DIAGNOSIS — F068 Other specified mental disorders due to known physiological condition: Secondary | ICD-10-CM

## 2013-01-25 DIAGNOSIS — Z111 Encounter for screening for respiratory tuberculosis: Secondary | ICD-10-CM

## 2013-01-25 DIAGNOSIS — E785 Hyperlipidemia, unspecified: Secondary | ICD-10-CM

## 2013-01-25 DIAGNOSIS — E039 Hypothyroidism, unspecified: Secondary | ICD-10-CM

## 2013-01-25 DIAGNOSIS — I10 Essential (primary) hypertension: Secondary | ICD-10-CM

## 2013-01-25 LAB — HEPATIC FUNCTION PANEL
Alkaline Phosphatase: 67 U/L (ref 39–117)
Bilirubin, Direct: 0 mg/dL (ref 0.0–0.3)
Total Protein: 7.2 g/dL (ref 6.0–8.3)

## 2013-01-25 LAB — BASIC METABOLIC PANEL
CO2: 28 mEq/L (ref 19–32)
Calcium: 9 mg/dL (ref 8.4–10.5)
Chloride: 108 mEq/L (ref 96–112)
Sodium: 139 mEq/L (ref 135–145)

## 2013-01-25 NOTE — Assessment & Plan Note (Signed)
fari control- check bmet

## 2013-01-25 NOTE — Progress Notes (Signed)
Memory loss- progressive Husband is primary care giver and he is doing very well in that position  Reviewed pmh, psh, sochx

## 2013-01-25 NOTE — Assessment & Plan Note (Signed)
This is her overwhelming issue- Discussed assisted living and completed forms She has seen neurology  She is DNR and has an out of hospital arrest form completed

## 2013-01-25 NOTE — Addendum Note (Signed)
Addended by: Alfred Levins D on: 01/25/2013 09:09 AM   Modules accepted: Orders

## 2013-01-25 NOTE — Assessment & Plan Note (Signed)
Will check labs today

## 2013-01-26 ENCOUNTER — Other Ambulatory Visit: Payer: Self-pay | Admitting: *Deleted

## 2013-01-26 MED ORDER — LEVOTHYROXINE SODIUM 112 MCG PO TABS: 112.0000 ug | ORAL_TABLET | Freq: Every day | ORAL | Status: DC

## 2013-01-30 ENCOUNTER — Telehealth: Payer: Self-pay

## 2013-01-30 NOTE — Telephone Encounter (Signed)
[facility] Triage Call Report Triage Record Num: [id number] Operator: Vira Browns Patient Name: Veronica Morales Call Date & Time: 01/28/2013 3:15:57PM Patient Phone: [phone] PCP: Valetta Mole. Swords Patient Gender: Female PCP Fax : [phone] Patient DOB: [birth year] [facility] Reason for Call: Caller: Crystal/LPN; PCP: Birdie Sons (Adults only); CB#: [phone]; Call regarding New admit, agitated; 01/28/13 new arrival to facility, very agitated, unable to get vitals, in locked care due dementia. No prn meds for agitation. Husband states agitation is the same as always, not new or worsening. All emergent sxs ruled out as per Confusion, Disorientation, Agitation protocol with exception to "all other situations.' Per standing orders, Ativan 0.5mg  po or IM, may repeat in 20 min if no response. Notify PCP in AM for further orders. Per Crystal, LPN - facility requires a hard script for Ativan and states she will follow up with the office on Monday for further orders and redirect the pt's behavior in the meantime. Protocol(s) Used: Confusion, Disorientation, Agitation Recommended Outcome per Protocol: See Provider within 72 Hours Reason for Outcome: All other situations Care Advice: ~

## 2013-01-30 NOTE — Telephone Encounter (Signed)
Veronica Morales at [facility] needs a script dfaxed to [phone], PRN for agitation.  Thanks a bunch  [employer]

## 2013-01-30 NOTE — Telephone Encounter (Signed)
Call and see how she is doing.  No reason to do vitals

## 2013-01-31 NOTE — Telephone Encounter (Signed)
Pt is pacing back and forth down the hallways, resisting care, won't shower, wants to go home.  Crystal, LPN is wanting something like ativan to help calm her down.  They need rx faxed to them with PRN orders.  Please advise

## 2013-02-08 ENCOUNTER — Ambulatory Visit: Payer: Medicare Other | Admitting: Internal Medicine

## 2013-04-18 ENCOUNTER — Telehealth: Payer: Self-pay | Admitting: Internal Medicine

## 2013-04-18 NOTE — Telephone Encounter (Signed)
Crystal called stating pt has been roaming the halls at night.  She is taking donepezil but it is not working.

## 2013-04-19 NOTE — Telephone Encounter (Signed)
This is part of her disease. Best treatment is no napping or sleeping during the day.

## 2013-04-19 NOTE — Telephone Encounter (Signed)
Left message on crystals voicemail

## 2013-05-01 ENCOUNTER — Encounter: Payer: Self-pay | Admitting: Internal Medicine

## 2013-05-01 ENCOUNTER — Other Ambulatory Visit: Payer: Medicare Other

## 2013-07-05 ENCOUNTER — Encounter (HOSPITAL_COMMUNITY): Payer: Self-pay | Admitting: Emergency Medicine

## 2013-07-05 ENCOUNTER — Emergency Department (HOSPITAL_COMMUNITY)
Admission: EM | Admit: 2013-07-05 | Discharge: 2013-07-06 | Payer: Medicare Other | Attending: Emergency Medicine | Admitting: Emergency Medicine

## 2013-07-05 ENCOUNTER — Emergency Department (HOSPITAL_COMMUNITY): Payer: Medicare Other

## 2013-07-05 DIAGNOSIS — F0390 Unspecified dementia without behavioral disturbance: Secondary | ICD-10-CM

## 2013-07-05 DIAGNOSIS — Z79899 Other long term (current) drug therapy: Secondary | ICD-10-CM | POA: Insufficient documentation

## 2013-07-05 DIAGNOSIS — E785 Hyperlipidemia, unspecified: Secondary | ICD-10-CM | POA: Insufficient documentation

## 2013-07-05 DIAGNOSIS — R4689 Other symptoms and signs involving appearance and behavior: Secondary | ICD-10-CM | POA: Diagnosis present

## 2013-07-05 DIAGNOSIS — F0391 Unspecified dementia with behavioral disturbance: Secondary | ICD-10-CM | POA: Insufficient documentation

## 2013-07-05 DIAGNOSIS — I10 Essential (primary) hypertension: Secondary | ICD-10-CM | POA: Insufficient documentation

## 2013-07-05 DIAGNOSIS — F03918 Unspecified dementia, unspecified severity, with other behavioral disturbance: Secondary | ICD-10-CM

## 2013-07-05 DIAGNOSIS — Z86010 Personal history of colonic polyps: Secondary | ICD-10-CM

## 2013-07-05 DIAGNOSIS — F068 Other specified mental disorders due to known physiological condition: Secondary | ICD-10-CM | POA: Diagnosis present

## 2013-07-05 DIAGNOSIS — Z880 Allergy status to penicillin: Secondary | ICD-10-CM | POA: Insufficient documentation

## 2013-07-05 DIAGNOSIS — E039 Hypothyroidism, unspecified: Secondary | ICD-10-CM | POA: Insufficient documentation

## 2013-07-05 DIAGNOSIS — H9190 Unspecified hearing loss, unspecified ear: Secondary | ICD-10-CM | POA: Insufficient documentation

## 2013-07-05 DIAGNOSIS — M810 Age-related osteoporosis without current pathological fracture: Secondary | ICD-10-CM | POA: Insufficient documentation

## 2013-07-05 DIAGNOSIS — Z87891 Personal history of nicotine dependence: Secondary | ICD-10-CM | POA: Insufficient documentation

## 2013-07-05 LAB — ETHANOL: Alcohol, Ethyl (B): <11 mg/dL (ref 0–11)

## 2013-07-05 LAB — RAPID URINE DRUG SCREEN, HOSP PERFORMED
Amphetamines: NOT DETECTED
Barbiturates: NOT DETECTED
Benzodiazepines: NOT DETECTED
COCAINE: NOT DETECTED
OPIATES: NOT DETECTED
TETRAHYDROCANNABINOL: NOT DETECTED

## 2013-07-05 LAB — URINALYSIS, ROUTINE W REFLEX MICROSCOPIC
Bilirubin Urine: NEGATIVE
GLUCOSE, UA: NEGATIVE mg/dL
HGB URINE DIPSTICK: NEGATIVE
Ketones, ur: NEGATIVE mg/dL
Leukocytes, UA: NEGATIVE
Nitrite: NEGATIVE
Protein, ur: NEGATIVE mg/dL
Specific Gravity, Urine: 1.026 (ref 1.005–1.030)
UROBILINOGEN UA: 1 mg/dL (ref 0.0–1.0)
pH: 6.5 (ref 5.0–8.0)

## 2013-07-05 LAB — COMPREHENSIVE METABOLIC PANEL
ALT: 15 U/L (ref 0–35)
AST: 22 U/L (ref 0–37)
Albumin: 3.4 g/dL — ABNORMAL LOW (ref 3.5–5.2)
Alkaline Phosphatase: 107 U/L (ref 39–117)
BUN: 19 mg/dL (ref 6–23)
CALCIUM: 9.4 mg/dL (ref 8.4–10.5)
CO2: 22 mEq/L (ref 19–32)
Chloride: 105 mEq/L (ref 96–112)
Creatinine, Ser: 0.59 mg/dL (ref 0.50–1.10)
GFR calc non Af Amer: 85 mL/min — ABNORMAL LOW (ref >90)
Glucose, Bld: 100 mg/dL — ABNORMAL HIGH (ref 70–99)
Potassium: 5 mEq/L (ref 3.7–5.3)
SODIUM: 141 meq/L (ref 137–147)
Total Bilirubin: <0.2 mg/dL — ABNORMAL LOW (ref 0.3–1.2)
Total Protein: 7.4 g/dL (ref 6.0–8.3)

## 2013-07-05 LAB — CBC
HCT: 41.2 % (ref 36.0–46.0)
Hemoglobin: 13.4 g/dL (ref 12.0–15.0)
MCH: 28.2 pg (ref 26.0–34.0)
MCHC: 32.5 g/dL (ref 30.0–36.0)
MCV: 86.7 fL (ref 78.0–100.0)
PLATELETS: 238 10*3/uL (ref 150–400)
RBC: 4.75 MIL/uL (ref 3.87–5.11)
RDW: 14.1 % (ref 11.5–15.5)
WBC: 8.3 10*3/uL (ref 4.0–10.5)

## 2013-07-05 MED ORDER — ALUM & MAG HYDROXIDE-SIMETH 200-200-20 MG/5ML PO SUSP: 30.0000 mL | ORAL | Status: DC | PRN

## 2013-07-05 MED ORDER — ACETAMINOPHEN 325 MG PO TABS: 650.0000 mg | ORAL_TABLET | ORAL | Status: DC | PRN

## 2013-07-05 MED ORDER — LORAZEPAM 2 MG/ML IJ SOLN: 1.0000 mg | Freq: Once | INTRAMUSCULAR | Status: DC

## 2013-07-05 MED ORDER — LORAZEPAM 2 MG/ML IJ SOLN
1.0000 mg | Freq: Once | INTRAMUSCULAR | Status: AC
  Administered 2013-07-05: 1 mg via INTRAMUSCULAR
  Filled 2013-07-05: qty 1

## 2013-07-05 MED ORDER — ZOLPIDEM TARTRATE 5 MG PO TABS: 5.0000 mg | ORAL_TABLET | Freq: Every evening | ORAL | Status: DC | PRN

## 2013-07-05 MED ORDER — ZIPRASIDONE MESYLATE 20 MG IM SOLR
10.0000 mg | Freq: Once | INTRAMUSCULAR | Status: AC
  Administered 2013-07-05: 10 mg via INTRAMUSCULAR
  Filled 2013-07-05: qty 20

## 2013-07-05 MED ORDER — NICOTINE 21 MG/24HR TD PT24: 21.0000 mg | MEDICATED_PATCH | Freq: Every day | TRANSDERMAL | Status: DC

## 2013-07-05 MED ORDER — ONDANSETRON HCL 4 MG PO TABS: 4.0000 mg | ORAL_TABLET | Freq: Three times a day (TID) | ORAL | Status: DC | PRN

## 2013-07-05 MED ORDER — STERILE WATER FOR INJECTION IJ SOLN
INTRAMUSCULAR | Status: AC
  Administered 2013-07-05: 1.2 mL
  Filled 2013-07-05: qty 10

## 2013-07-05 MED ORDER — LORAZEPAM 2 MG/ML IJ SOLN
1.0000 mg | Freq: Once | INTRAMUSCULAR | Status: AC
  Administered 2013-07-05: 1 mg via INTRAVENOUS
  Filled 2013-07-05: qty 1

## 2013-07-05 MED ORDER — LORAZEPAM 1 MG PO TABS: 1.0000 mg | ORAL_TABLET | Freq: Three times a day (TID) | ORAL | Status: DC | PRN

## 2013-07-05 NOTE — ED Notes (Signed)
Bed: [id number] Expected date:  Expected time:  Means of arrival:  Comments: EMS- Elderly, combative

## 2013-07-05 NOTE — ED Provider Notes (Signed)
CSN: [id number]     Arrival date & time 07/05/13  1744 History   First MD Initiated Contact with Patient 07/05/13 1801     Chief Complaint  Patient presents with  . Aggressive Behavior  . Dementia     (Consider location/radiation/quality/duration/timing/severity/associated sxs/prior Treatment) HPI A LEVEL 5 CAVEAT PERTAINS DUE TO DEMENTIA Pt [facility] with increase in aggressive behavior.  She has hx of dementia.  Is currently taking trazodone, haldol and aricept.  No hx of fevers or other recent illness.   Past Medical History  Diagnosis Date  . Colon polyps   . Hyperlipidemia   . Hypertension   . Hypothyroidism   . Osteoporosis   . Dementia   . Insomnia   . Hearing deficit     Hearing aid, right   Past Surgical History  Procedure Laterality Date  . Ankle fracture surgery     Family History  Problem Relation Age of Onset  . Dementia Mother   . Cancer - Prostate Brother   . Stroke Father    History  Substance Use Topics  . Smoking status: Former Smoker    Types: Cigarettes    Quit date: 12/16/1990  . Smokeless tobacco: Former NeurosurgeonUser    Quit date: 07/15/1987  . Alcohol Use: 4.0 oz/week    8 drink(s) per week     Comment: 1-2 glasses daily   OB History   Grav Para Term Preterm Abortions TAB SAB Ect Mult Living                 Review of Systems UNABLE TO OBTAIN ROS DUE TO LEVEL 5 CAVEAT    Allergies  Amoxicillin and Penicillins  Home Medications   Current Outpatient Rx  Name  Route  Sig  Dispense  Refill  . desonide (DESOWEN) 0.05 % cream   Topical   Apply 1 application topically 2 (two) times daily. Cheeks chin and forehead         . divalproex (DEPAKOTE SPRINKLE) 125 MG capsule   Oral   Take 125 mg by mouth at bedtime.         . furosemide (LASIX) 20 MG tablet   Oral   Take 20 mg by mouth daily.         Marland Kitchen. levothyroxine (SYNTHROID, LEVOTHROID) 112 MCG tablet   Oral   Take 1 tablet (112 mcg total) by mouth daily.    90 tablet   3   . lisinopril (PRINIVIL,ZESTRIL) 10 MG tablet   Oral   Take 1 tablet (10 mg total) by mouth daily.   90 tablet   0   . memantine (NAMENDA) 5 MG tablet   Oral   Take 5 mg by mouth 2 (two) times daily.         . naproxen sodium (ANAPROX) 220 MG tablet   Oral   Take 220 mg by mouth 2 (two) times daily as needed. pain          BP 99/55  Pulse 83  Temp(Src) 98.5 F (36.9 C) (Oral)  Resp 18  SpO2 95% Vitals reviewed Physical Exam Physical Examination: General appearance - alert, anxious and agitated appearing, and in no distress Mental status - alert, oriented to person only Eyes - pupils equal and reactive, extraocular eye movements intact Mouth - mucous membranes moist, pharynx normal without lesions Chest - clear to auscultation, no wheezes, rales or rhonchi, symmetric air entry Heart - normal rate, regular rhythm, normal S1, S2, no  murmurs, rubs, clicks or gallops Abdomen - soft, nontender, nondistended, no masses or organomegaly Neurological - alert, not oriented, moving all extremities, shouting Extremities - peripheral pulses normal, no pedal edema, no clubbing or cyanosis Skin - normal coloration and turgor, no rashes Psych- shouting, anxious, not easily directable  ED Course  Procedures (including critical care time)  CRITICAL CARE Performed by: Ethelda Chick Total critical care time: 35 Critical care time was exclusive of separately billable procedures and treating other patients. Critical care was necessary to treat or prevent imminent or life-threatening deterioration. Critical care was time spent personally by me on the following activities: development of treatment plan with patient and/or surrogate as well as nursing, discussions with consultants, evaluation of patient's response to treatment, examination of patient, obtaining history from patient or surrogate, ordering and performing treatments and interventions, ordering and review of  laboratory studies, ordering and review of radiographic studies, pulse oximetry and re-evaluation of patient's condition. Labs Review Labs Reviewed  COMPREHENSIVE METABOLIC PANEL - Abnormal; Notable for the following:    Glucose, Bld 100 (*)    Albumin 3.4 (*)    Total Bilirubin <0.2 (*)    GFR calc non Af Amer 85 (*)    All other components within normal limits  CBC  ETHANOL  URINALYSIS, ROUTINE W REFLEX MICROSCOPIC  URINE RAPID DRUG SCREEN (HOSP PERFORMED)   Imaging Review Ct Head Wo Contrast  07/05/2013   CLINICAL DATA Dementia.  EXAM CT HEAD WITHOUT CONTRAST  TECHNIQUE Contiguous axial images were obtained from the base of the skull through the vertex without intravenous contrast.  COMPARISON CT scan of March 02, 2006.  FINDINGS Bony calvarium appears intact. Mild diffuse cortical atrophy is noted. Chronic ischemic white matter disease is noted. No mass effect or midline shift is noted. Ventricular size is within normal limits. There is no evidence of mass lesion, hemorrhage or acute infarction.  IMPRESSION Mild diffuse cortical atrophy. Chronic ischemic white matter disease. No acute intracranial abnormality seen.  SIGNATURE  Electronically Signed   By: Roque Lias M.D.   On: 07/05/2013 21:48     EKG Interpretation None      MDM   Final diagnoses:  Dementia  Dementia with aggressive behavior    Pt presenting with increase in aggressive behaviors. Pt has hx of dementia- she is currently taking medications, has been wandering at her facility.  Today was shouting and being aggressive towards staff and patients.  Pt is medically cleared including head CT.  She was medically restrained with geodon and ativan, placed in soft restraints for her own safety as well.  This did help with her agitation.  Awaiting TTS consult.      Ethelda Chick, MD 07/06/13 2212

## 2013-07-05 NOTE — ED Notes (Signed)
Pt's husband: Hortense RamalBill Baltes: [phone]

## 2013-07-05 NOTE — ED Notes (Signed)
Family requesting more medication to help further calm pt.

## 2013-07-05 NOTE — ED Notes (Signed)
[facility] report pt. To have very aggressive behavior toward other clients and to staff this afternoon.  She arrives very loud and manic in her demeanor.

## 2013-07-06 ENCOUNTER — Encounter (HOSPITAL_COMMUNITY): Payer: Self-pay | Admitting: Registered Nurse

## 2013-07-06 DIAGNOSIS — R4689 Other symptoms and signs involving appearance and behavior: Secondary | ICD-10-CM | POA: Diagnosis present

## 2013-07-06 MED ORDER — LORAZEPAM 2 MG/ML IJ SOLN
0.5000 mg | Freq: Once | INTRAMUSCULAR | Status: AC
  Administered 2013-07-06: 0.5 mg via INTRAVENOUS
  Filled 2013-07-06: qty 1

## 2013-07-06 NOTE — ED Notes (Signed)
Pt woke up and was found at her door naked.  Pt was oriented back to the bed but quickly scooted to the edge of bed again.  Currently staff cannot orient pt back and is becoming aggressive. Dr. Rhunette CroftNanavati made aware.

## 2013-07-06 NOTE — ED Notes (Signed)
1 pt belonging bag in locker #27 

## 2013-07-06 NOTE — ED Notes (Signed)
Not able to take pts temp at this time due t pt not able to open mouth...kjl

## 2013-07-06 NOTE — Discharge Instructions (Signed)
Alzheimer Disease Alzheimer Disease (AD) is a mental disorder. It causes memory loss and loss of other mental functions, such as learning, thinking, solving problems, communicating, and completing tasks. The mental losses interfere with the ability to perform daily activities at work, at home, or in social situations. AD usually starts in the late 60s or early 88s but can start earlier in life (familial form). The mental changes caused by AD are permanent and worsen over time. As the illness progresses, the ability to do even the simplest things is lost. Survival with AD ranges from several years to as long as 20 years. CAUSES AD is caused by abnormally high levels of a protein (beta-amyloid) in the brain. This protein forms very small deposits within and around the brain's nerve cells. These deposits prevent the nerve cells from working properly. Experts are not certain what causes the beta-amyloid deposits in AD. RISK FACTORS The following major risk factors have been identified:  Increasing age.  Certain genetic variations, such as Down syndrome (trisomy 21). SYMPTOMS The earliest mental change in AD is mild memory loss of recent events, names, or phone numbers. Other symptoms at the beginning of AD include loss of objects, minor loss of vocabulary, and difficulty with complex tasks, such as paying bills or driving in unfamiliar locations. At this stage, you are still able to perform daily activities but need greater effort, more time, or memory aids. Other mental functions deteriorate as AD worsens. These changes slowly go from mild to severe. Symptoms at this stage include:  Difficulty remembering You may not be able to recall personal information such as your address and telephone number. You may become confused about the date, the season of the year, or your location.  Difficulty maintaining attention You may forget what you wanted to say during conversations and repeat what you have already  said.  Difficulty learning new information or tasks You may not remember what you read or the name of a new friend you met.  Difficulty counting or doing math You may have difficulty with complex math problems. You may make mistakes in paying bills or managing your checkbook.  Poor reasoning and judgment You may make poor decisions or not dress right for the weather.  Difficulty communicating You may have regular difficulty remembering words, naming objects, expressing yourself clearly, or writing sentences that make sense.  Difficulty performing familiar daily activities You may get lost driving in familiar locations or need help eating, bathing, dressing, grooming, or using the toilet. You may have difficulty maintaining bladder or bowel control.  Difficulty recognizing familiar faces You may confuse family members or close friends with one another. You may not recognize a close relative or may mistake strangers for family. AD also may cause changes in personality and behavior. These changes include loss of interest or motivation, social withdrawal, anxiety, difficulty sleeping, uncharacteristic anger or combativeness, a false belief that someone is trying to harm you (paranoia), seeing things that are not real (hallucinations), or agitation. Confusion and disruptive behavior are often worse at night and may be triggered by changes in the environment or acute medical issues. DIAGNOSIS  AD is diagnosed through an assessment by your health care provider. During this assessment, your health care provider will do the following:  Ask you and your family, friends, or caregiver questions about your symptoms, their frequency, their duration and progression, and the effect they are having on your life.  Ask questions about your personal and family medical history and use  of alcohol or drugs, including prescription medicine.  Perform a physical exam and order blood tests and brain imaging exams. Your  health care provider may refer you to a specialist for detailed evaluation of your mental functions (neuropsychological testing).  Many different brain disorders, medical conditions, and certain substances can cause symptoms that resemble AD symptoms. These must be ruled out before AD can be diagnosed. If AD is diagnosed, it will be considered either "possible" or "probable" AD. "Possible" AD means that your symptoms are typical of AD and no other disorder is causing them. "Probable" AD means that you also have a family history of AD or genetic test results that support the diagnosis. Certain tests, mostly used in research studies, are highly specific for AD.  TREATMENT  There is currently no cure for AD. The goals of treatment are to:  Slow down the progression of the disease.  Preserve mental function as long as possible.  Manage behavioral symptoms.  Make life easier for the person with AD and their caregivers. The following treatment options are available:  Medicine Certain medicines may help slow memory loss by changing the level of certain chemicals in the brain. Medicine may also help with behavioral symptoms.  Talk therapy Talk therapy provides education, support, and memory aids for people with AD. It is most effective in the early stages of the illness.  Caregiving Caregivers may be family members, friends, or trained medical professionals. They help the person with AD with daily life activities. Caregiving may take place at home or at a nursing facility.  Family support groups These provide education, emotional support, and information about community resources to family members who are taking care of the person with AD. Document Released: 12/24/2003 Document Revised: 12/14/2012 Document Reviewed: 08/19/2012 [facility] Patient Information 2014 [place], [place].  Dementia Dementia is a word that is used to describe problems with the brain and how it works. People with dementia have  memory loss. They may also have problems with thinking, speaking, or solving problems. It can affect how they act around people, how they do their job, their mood, and their personality. These changes may not show up for a long time. Family or friends may not notice problems in the early part of this disease. HOME CARE The following tips are for the person living with, or caring for, the person with dementia. Make the home safe.  Remove locks on bathroom doors.  Use childproof locks on cabinets where alcohol, cleaning supplies, or chemicals are stored.  Put outlet covers in electrical outlets.  Put in childproof locks to keep doors and windows safe.  Remove stove knobs, or put in safety knobs that shut off on their own.  Lower the temperature on water heaters.  Label medicines. Lock them in a safe place.  Keep knives, lighters, matches, power tools, and guns out of reach or in a safe place.  Remove objects that might break or can hurt the person.  Make sure lighting is good inside and outside.  Put in grab bars if needed.  Use a device that detects falls or other needs for help. Lessen confusion.  Keep familiar objects and people around.  Use night lights or low lit (dim) lights at night.  Label objects or areas.  Use reminders, notes, or directions for daily activities or tasks.  Keep a simple routine that is the same for waking, meals, bathing, dressing, and bedtime.  Create a calm and quiet home.  Put up clocks and calendars.  Keep emergency numbers and the home address near all phones.  Help show the different times of day. Open the curtains during the day to let light in. Speak clearly and directly.  Choose simple words and short sentences.  Use a gentle, calm voice.  Do not interrupt.  If the person has a hard time finding a word to use, give them the word or thought.  Ask 1 question at a time. Give enough time for the person to answer. Repeat the  question if the person does not answer. Do things that lessen restlessness.  Provide a comfortable bed.  Have the same bedtime routine every night.  Have a regular walking and activity schedule.  Lessen naps during the day.  Do not let the person drink a lot of caffeine.  Go to events that are not overwhelming. Eat well and drink fluids.  Lessen distractions during meal times and snacks.  Avoid foods that are too hot or too cold.  Watch how the person chews and swallows. This is to make sure they do not choke. Other  Keep all vision, hearing, dental, and medical visits with the doctor.  Only give medicines as told by the doctor.  Watch the person's driving ability. Do not let the person drive if he or she cannot drive safely.  Use a program that helps find a person if they become missing. You may need to register with this program. GET HELP RIGHT AWAY IF:   A fever of 102 F (38.9 C) develops.  Confusion develops or gets worse.  Sleepiness develops or gets worse.  Staying awake is hard to do.  New behavior problems start like mood swings, aggression, and seeing things that are not there.  Problems with balance, speech, or falling develop.  Problems swallowing develop.  Any problems of another sickness develop. MAKE SURE YOU:  Understand these instructions.  Will watch his or her condition.  Will get help right away if he or she is not doing well or gets worse. Document Released: 03/26/2008 Document Revised: 07/06/2011 Document Reviewed: 09/08/2010 [facility] Patient Information 2014 [place], [place].

## 2013-07-06 NOTE — Progress Notes (Signed)
Per psychiatry, patient stable for discharge back [facility]. CS[facility] spoke with [facility] at [facility]. Pt will be referred to psych on site to help manage patient psychotropic medications. RN can call report to [phone] and ask for crystal.   Byrd HesselbachKristen Krisy Dix, LCSW [phone]  ED CSW 07/06/2013 1403pm

## 2013-07-06 NOTE — Consult Note (Signed)
Face to face evaluation and I agree with this note 
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
## 2013-07-06 NOTE — ED Notes (Signed)
Charge RN [facility], pt will get TTS consult this am.

## 2013-07-26 ENCOUNTER — Ambulatory Visit: Payer: Medicare Other | Admitting: Neurology

## 2013-08-01 ENCOUNTER — Ambulatory Visit: Payer: Medicare Other | Admitting: Internal Medicine

## 2013-08-27 ENCOUNTER — Emergency Department (HOSPITAL_COMMUNITY): Payer: Medicare Other

## 2013-08-27 ENCOUNTER — Encounter (HOSPITAL_COMMUNITY): Payer: Self-pay | Admitting: Emergency Medicine

## 2013-08-27 ENCOUNTER — Emergency Department (HOSPITAL_COMMUNITY)
Admission: EM | Admit: 2013-08-27 | Discharge: 2013-08-27 | Disposition: A | Discharge status: Home / Self Care | Payer: Medicare Other | Attending: Emergency Medicine | Admitting: Emergency Medicine

## 2013-08-27 DIAGNOSIS — R221 Localized swelling, mass and lump, neck: Principal | ICD-10-CM

## 2013-08-27 DIAGNOSIS — Z860100 Personal history of colon polyps, unspecified: Secondary | ICD-10-CM | POA: Insufficient documentation

## 2013-08-27 DIAGNOSIS — Y9210 Unspecified residential institution as the place of occurrence of the external cause: Secondary | ICD-10-CM | POA: Insufficient documentation

## 2013-08-27 DIAGNOSIS — Z87891 Personal history of nicotine dependence: Secondary | ICD-10-CM | POA: Insufficient documentation

## 2013-08-27 DIAGNOSIS — Z79899 Other long term (current) drug therapy: Secondary | ICD-10-CM | POA: Insufficient documentation

## 2013-08-27 DIAGNOSIS — R220 Localized swelling, mass and lump, head: Secondary | ICD-10-CM | POA: Insufficient documentation

## 2013-08-27 DIAGNOSIS — I10 Essential (primary) hypertension: Secondary | ICD-10-CM | POA: Insufficient documentation

## 2013-08-27 DIAGNOSIS — Y939 Activity, unspecified: Secondary | ICD-10-CM | POA: Insufficient documentation

## 2013-08-27 DIAGNOSIS — W19XXXA Unspecified fall, initial encounter: Secondary | ICD-10-CM

## 2013-08-27 DIAGNOSIS — Z8739 Personal history of other diseases of the musculoskeletal system and connective tissue: Secondary | ICD-10-CM | POA: Insufficient documentation

## 2013-08-27 DIAGNOSIS — Y92129 Unspecified place in nursing home as the place of occurrence of the external cause: Secondary | ICD-10-CM

## 2013-08-27 DIAGNOSIS — Z880 Allergy status to penicillin: Secondary | ICD-10-CM | POA: Insufficient documentation

## 2013-08-27 DIAGNOSIS — F0390 Unspecified dementia without behavioral disturbance: Secondary | ICD-10-CM | POA: Insufficient documentation

## 2013-08-27 DIAGNOSIS — E039 Hypothyroidism, unspecified: Secondary | ICD-10-CM | POA: Insufficient documentation

## 2013-08-27 DIAGNOSIS — Z8669 Personal history of other diseases of the nervous system and sense organs: Secondary | ICD-10-CM | POA: Insufficient documentation

## 2013-08-27 NOTE — ED Notes (Signed)
[facility]  Spoke with Lanora ManisElizabeth.

## 2013-08-27 NOTE — ED Provider Notes (Signed)
CSN: [id number]     Arrival date & time 08/27/13  2010 History   First MD Initiated Contact with Patient 08/27/13 2022     Chief Complaint  Patient presents with  . Fall      Patient is a 80 y.o. female presenting with fall. The history is provided by the EMS personnel, the nursing home and a relative. The history is limited by the condition of the patient (Hx dementia).  Fall  Pt was seen at 2025. Per EMS, NH report and pt's family: pt s/p fall at [facility]. NH states pt has "a bump on her head." Pt has hx of frequent falls and dementia. Pt currently denies any complaints. Pt's family states she is acting per her baseline.      Past Medical History  Diagnosis Date  . Colon polyps   . Hyperlipidemia   . Hypertension   . Hypothyroidism   . Osteoporosis   . Dementia   . Insomnia   . Hearing deficit     Hearing aid, right   Past Surgical History  Procedure Laterality Date  . Ankle fracture surgery     Family History  Problem Relation Age of Onset  . Dementia Mother   . Cancer - Prostate Brother   . Stroke Father    History  Substance Use Topics  . Smoking status: Former Smoker    Types: Cigarettes    Quit date: 12/16/1990  . Smokeless tobacco: Former Neurosurgeon    Quit date: 07/15/1987  . Alcohol Use: 4.0 oz/week    8 drink(s) per week     Comment: 1-2 glasses daily    Review of Systems  Unable to perform ROS: Dementia      Allergies  Amoxicillin and Penicillins  Home Medications   Prior to Admission medications   Medication Sig Start Date End Date Taking? Authorizing Provider  clotrimazole-betamethasone (LOTRISONE) cream Apply 1 application topically 2 (two) times daily. Apply to buttocks and perineum   Yes Historical Provider, MD  divalproex (DEPAKOTE SPRINKLE) 125 MG capsule Take 500 mg by mouth 2 (two) times daily. Takes 4 caps around lunch and 4 caps around supper time, each day   Yes Historical Provider, MD  docusate sodium (COLACE) 100 MG capsule Take 100 mg by  mouth 2 (two) times daily.   Yes Historical Provider, MD  furosemide (LASIX) 20 MG tablet Take 20 mg by mouth daily.   Yes Historical Provider, MD  lactobacillus [facility]) CHEW chewable tablet Chew 1 tablet by mouth 3 (three) times daily with meals.   Yes Historical Provider, MD  levothyroxine (SYNTHROID, LEVOTHROID) 100 MCG tablet Take 100 mcg by mouth at bedtime.   Yes Historical Provider, MD  lisinopril (PRINIVIL,ZESTRIL) 5 MG tablet Take 5 mg by mouth daily.   Yes Historical Provider, MD  traZODone (DESYREL) 100 MG tablet Take 100 mg by mouth at bedtime.   Yes Historical Provider, MD  desonide (DESOWEN) 0.05 % cream Apply 1 application topically 2 (two) times daily. Cheeks chin and forehead    Historical Provider, MD  levothyroxine (SYNTHROID, LEVOTHROID) 112 MCG tablet Take 1 tablet (112 mcg total) by mouth daily. 01/26/13   Lindley Magnus, MD  lisinopril (PRINIVIL,ZESTRIL) 10 MG tablet Take 1 tablet (10 mg total) by mouth daily. 07/18/12 08/21/14  Lindley Magnus, MD  memantine (NAMENDA) 5 MG tablet Take 5 mg by mouth 2 (two) times daily.    Historical Provider, MD  naproxen sodium (ANAPROX) 220 MG tablet Take 220 mg  by mouth 2 (two) times daily as needed. pain    Historical Provider, MD   BP 119/96  Temp(Src) 97.4 F (36.3 C)  Ht 5\' 2"  (1.575 m)  Wt 120 lb (54.432 kg)  BMI 21.94 kg/m2  SpO2 94% Physical Exam 2030: Physical examination: Vital signs and O2 SAT: Reviewed; Constitutional: Well developed, Well nourished, Well hydrated, In no acute distress; Head and Face: Normocephalic, +right posterior scalp with small hematoma, no open wounds.; Eyes: EOMI, PERRL, No scleral icterus; ENMT: Mouth and pharynx normal, Left TM normal, Right TM normal, Mucous membranes moist; Neck: Supple, Trachea midline; Spine: No midline CS, TS, LS tenderness.; Cardiovascular: Regular rate and rhythm, No gallop; Respiratory: Breath sounds clear & equal bilaterally, No rales, rhonchi, wheezes, Normal respiratory  effort/excursion; Chest: Nontender, No deformity, Movement normal, No crepitus, No abrasions or ecchymosis.; Abdomen: Soft, Nontender, Nondistended, Normal bowel sounds, No abrasions or ecchymosis.; Genitourinary: No CVA tenderness;; Extremities: No deformity, Full range of motion major/large joints of bilat UE's and LE's without pain or tenderness to palp, Neurovascularly intact, Pulses normal, No tenderness, No edema, Pelvis stable; Neuro: Awake, alert, confused re: time, place, events per hx dementia. Major CN grossly intact. Speech clear. No facial droop. Grips equal. Strength 5/5 equal bilat UE's and LE's. Pt moves all extremities spontaneously and to command without apparent gross focal motor deficits.; Skin: Color normal, Warm, Dry   ED Course  Procedures     EKG Interpretation None      MDM  MDM Reviewed: previous chart, nursing note and vitals Interpretation: CT scan     Ct Head Wo Contrast 08/27/2013   CLINICAL DATA:  Unwitnessed fall, bump on back of head. Patient is asymptomatic.  EXAM: CT HEAD WITHOUT CONTRAST  CT CERVICAL SPINE WITHOUT CONTRAST  TECHNIQUE: Multidetector CT imaging of the head and cervical spine was performed following the standard protocol without intravenous contrast. Multiplanar CT image reconstructions of the cervical spine were also generated.  COMPARISON:  CT HEAD W/O CM dated 07/05/2013  FINDINGS: CT HEAD FINDINGS  Moderately motion degraded examination of the skullbase. Moderate to severe ventriculomegaly, likely on the basis of global parenchymal brain volume loss as there is overall commensurate enlargement of cerebral sulci and cerebellar folia unchanged. No intraparenchymal hemorrhage, mass effect nor midline shift. Confluent supratentorial white matter hypodensities are non-specific suggest sequelae of severe chronic small vessel ischemic disease. No acute large vascular territory infarcts.  No abnormal extra-axial fluid collections. Basal cisterns are  patent. Moderate calcific atherosclerosis of the carotid siphons.  No skull fracture. The included ocular globes and orbital contents are non-suspicious. The mastoid aircells and included paranasal sinuses are well-aerated.  CT CERVICAL SPINE FINDINGS  Mild to moderately motion degraded examination. Cervical vertebral bodies appear intact with straightened cervical lordosis. Minimal grade 1 C7 anterolisthesis on degenerative basis. Of note, the spinous process of C7 is incompletely imaged. Moderate midcervical degenerative disc disease. C1-2 articulation maintained with severe arthropathy, calcified apical ligament. No destructive bony lesions. Mild broad levoscoliosis. Included prevertebral and paraspinal soft tissues are nonsuspicious; mild calcific atherosclerosis of the carotid bulbs.  The limited by motion, facet arthropathy and broad-based disc bulges resultant apparent moderate canal stenosis at C5-6, mild at C4-5. Severe C4-5 and C5-6 neural foraminal narrowing.  IMPRESSION: CT head: Moderately motion degraded examination without acute intracranial process.  Stable appearance of the head: Moderate to severe global brain atrophy with severe white matter changes suggesting chronic small vessel ischemic disease.  CT cervical spine: Motion degraded examination; C7  spinous process not imaged. Straightened cervical lordosis without acute fracture in the included cervical spine.  Grade 1 C7-T1 anterolisthesis on degenerative basis.   Electronically Signed   By: Awilda Metroourtnay  Bloomer   On: 08/27/2013 22:05   Ct Cervical Spine Wo Contrast 08/27/2013   CLINICAL DATA:  Unwitnessed fall, bump on back of head. Patient is asymptomatic.  EXAM: CT HEAD WITHOUT CONTRAST  CT CERVICAL SPINE WITHOUT CONTRAST  TECHNIQUE: Multidetector CT imaging of the head and cervical spine was performed following the standard protocol without intravenous contrast. Multiplanar CT image reconstructions of the cervical spine were also generated.   COMPARISON:  CT HEAD W/O CM dated 07/05/2013  FINDINGS: CT HEAD FINDINGS  Moderately motion degraded examination of the skullbase. Moderate to severe ventriculomegaly, likely on the basis of global parenchymal brain volume loss as there is overall commensurate enlargement of cerebral sulci and cerebellar folia unchanged. No intraparenchymal hemorrhage, mass effect nor midline shift. Confluent supratentorial white matter hypodensities are non-specific suggest sequelae of severe chronic small vessel ischemic disease. No acute large vascular territory infarcts.  No abnormal extra-axial fluid collections. Basal cisterns are patent. Moderate calcific atherosclerosis of the carotid siphons.  No skull fracture. The included ocular globes and orbital contents are non-suspicious. The mastoid aircells and included paranasal sinuses are well-aerated.  CT CERVICAL SPINE FINDINGS  Mild to moderately motion degraded examination. Cervical vertebral bodies appear intact with straightened cervical lordosis. Minimal grade 1 C7 anterolisthesis on degenerative basis. Of note, the spinous process of C7 is incompletely imaged. Moderate midcervical degenerative disc disease. C1-2 articulation maintained with severe arthropathy, calcified apical ligament. No destructive bony lesions. Mild broad levoscoliosis. Included prevertebral and paraspinal soft tissues are nonsuspicious; mild calcific atherosclerosis of the carotid bulbs.  The limited by motion, facet arthropathy and broad-based disc bulges resultant apparent moderate canal stenosis at C5-6, mild at C4-5. Severe C4-5 and C5-6 neural foraminal narrowing.  IMPRESSION: CT head: Moderately motion degraded examination without acute intracranial process.  Stable appearance of the head: Moderate to severe global brain atrophy with severe white matter changes suggesting chronic small vessel ischemic disease.  CT cervical spine: Motion degraded examination; C7 spinous process not imaged.  Straightened cervical lordosis without acute fracture in the included cervical spine.  Grade 1 C7-T1 anterolisthesis on degenerative basis.   Electronically Signed   By: Awilda Metroourtnay  Bloomer   On: 08/27/2013 22:05    2300:  No midline CS tenderness, FROM CS without midline tenderness. No NMS changes.  C-collar removed. Pt continues calm/cooperative, NAD, resps easy. Family states pt continues to act per her baseline and would like to have pt go back to NH now. Dx and testing d/w pt's family.  Questions answered.  Verb understanding, agreeable to d/c home with outpt f/u.    Laray AngerKathleen M Estelene Carmack, DO 08/29/13 1939

## 2013-08-27 NOTE — Discharge Instructions (Signed)
°Emergency Department Resource Guide °1) Find a Doctor and Pay Out of Pocket °Although you won't have to find out who is covered by your insurance plan, it is a good idea to ask around and get recommendations. You will then need to call the office and see if the doctor you have chosen will accept you as a new patient and what types of options they offer for patients who are self-pay. Some doctors offer discounts or will set up payment plans for their patients who do not have insurance, but you will need to ask so you aren't surprised when you get to your appointment. ° °2) Contact Your Local Health Department °Not all health departments have doctors that can see patients for sick visits, but many do, so it is worth a call to see if yours does. If you don't know where your local health department is, you can check in your phone book. The CDC also has a tool to help you locate your state's health department, and many state websites also have listings of all of their local health departments. ° °3) Find a Walk-in Clinic °If your illness is not likely to be very severe or complicated, you may want to try a walk in clinic. These are popping up all over the country in pharmacies, drugstores, and shopping centers. They're usually staffed by nurse practitioners or physician assistants that have been trained to treat common illnesses and complaints. They're usually fairly quick and inexpensive. However, if you have serious medical issues or chronic medical problems, these are probably not your best option. ° °No Primary Care Doctor: °- Call [facility] at  832-8000 - they can help you locate a primary care doctor that  accepts your insurance, provides certain services, etc. °- Physician Referral Service- 1-800-533-3463 ° °Chronic Pain Problems: °Organization         Address  Phone   Notes  °[facility]  (336) 297-2271 Patients need to be referred by their primary care doctor.  ° °Medication  Assistance: °Organization         Address  Phone   Notes  °[place] [facility] [address] °[place], [place] [address] (336) 641-8030 --Must be a resident of [place] °-- Must have NO insurance coverage whatsoever (no Medicaid/ Medicare, etc.) °-- The pt. MUST have a primary care doctor that directs their care regularly and follows them in the community °  °[facility]  (866) 331-1348   °[employer]  (888) 892-1162   ° °Agencies that provide inexpensive medical care: °Organization         Address  Phone   Notes  °[facility]  (336) 832-8035   °[facility]    (336) 832-7272   °[facility] [address] °[place],  [address] (336) 832-4777   °[facility] [address], °[place] (336) 271-4999   °[facility]    (336) 373-0678   °[facility]    (336) 272-1050   °[facility] ° [address], [place] Phone:  (336) 832-4444, Fax:  (336) 832-4440 Hours of Operation:  9 am - 6 pm, M-F.  Also accepts Medicaid/Medicare and self-pay.  °[facility] ° [address],  Phone: (336) 832-3150, Fax: (336) 832-3151. Hours of Operation:  8:30 am - 5:30 pm, M-F.  Also accepts Medicaid and self-pay.  °[facility] [place] [address], [place] Phone: (336) 878-6027   °[facility] [address], [place], [place] (336)723-1848, Ext. 123 Mondays & Thursdays: 7-9 AM.  First 15 patients are seen on a first come, first serve basis. °  ° °Medicaid-accepting [place] Providers: ° °Organization         Address  Phone   Notes  °[facility] [address], [place] (336) 641-2100 Also accepts self-pay patients.  °[facility] [address], [place] ° (336) 856-9996   °[facility] [address], [place]  (336) 288-8857   °[facility] [address] [place] [address], [place] (336) 299-7000   °Veita Bland [address], [place]  ° (336) 373-1557 Only accepts [place] Access Medicaid patients after they have their name applied to their card.  ° °Self-Pay (no insurance) in [place]: ° °Organization         Address  Phone   Notes  °Sickle Cell Patients, [facility] [address], [place] (336) 832-1970   °[facility] [address], [place] (336) 832-4400   °[facility] ° [address], [place] (336) 992-4800   °[facility]/Dr. Osei-Bonsu ° [address] [place] [address], [place] or [address], [place] (336) 841-8500 Phone number for both [place] and [place] locations is the same.  °[facility] [address], [place] (336) 299-0000   °[facility] [address] [place] [address], [place] or [address] (336) 852-7530 °(336) 878-2260   °[facility] [address], [place] (336) 350-1642, phone; (336) 294-5005, fax Sees patients 1st and 3rd Saturday of every month.  Must not qualify for public or private insurance (i.e. Medicaid, Medicare, [place] Health Choice, Veterans' Benefits) • Household income should be no more than 200% of the poverty level •The clinic cannot treat you if you are pregnant or think you are pregnant • Sexually transmitted diseases are not treated at the clinic.  ° ° °Dental Care: °Organization         Address  Phone  Notes  °[place] [facility] [address], [place] (336) 641-6152 Accepts children up to age 21 who are enrolled in Medicaid or [place] Health Choice; pregnant women with a Medicaid card; and children who have applied for Medicaid or [place] Health Choice, but were declined, whose parents can pay a reduced fee at time of service.  °[place]  [facility] [place]  [address], [place] (336) 641-7733 Accepts children up to age 21 who are enrolled in Medicaid or [place] Health Choice; pregnant women with a Medicaid card; and children who have applied for Medicaid or [place] Health Choice, but were declined, whose parents can pay a reduced fee at time of service.  °[facility] ° [address], [place] (336) 641-4533 Patients are seen by appointment only. Walk-ins are not accepted. [facility] will see patients 18 years of age and older. °Monday - Tuesday (8am-5pm) °Most Wednesdays (8:30-5pm) °$30 per visit, cash only  °[facility] ° [address], [place] (336) 641-4533 Patients are seen by appointment only. Walk-ins are not accepted. [facility] will see patients 18 years of age and older. °One   Wednesday Evening (Monthly: Volunteer Based).  $30 per visit, cash only  °[facility]  (919) 537-3737 for adults; Children under age 4, call Graduate Pediatric Dentistry at (919) [phone]. Children aged 4-14, please call (919) [phone] to request a pediatric application. ° Dental services are provided in all areas of dental care including fillings, crowns and bridges, complete and partial dentures, implants, gum treatment, root canals, and extractions. Preventive care is also provided. Treatment is provided to both adults and children. °Patients are selected via a lottery and there is often a waiting list. °  °[facility] [address], °[place] ° (336) 763-8833 [email or web address] °  °[facility] [address], [place], [place] (336)723-1848, Ext. 123 Second and Fourth Thursday of each month, opens at 6:30 AM; Clinic ends at 9 AM.  Patients are seen on a first-come first-served basis, and a limited number are seen during each clinic.  ° °[facility] ° [address], [place], [place] (336) 723-7904    Eligibility Requirements °You must have lived in [place], [place], or [place] counties for at least the last three months. °  You cannot be eligible for state or federal sponsored healthcare insurance, including Veterans Administration, Medicaid, or Medicare. °  You generally cannot be eligible for healthcare insurance through your employer.  °  How to apply: °Eligibility screenings are held every Tuesday and Wednesday afternoon from 1:00 pm until 4:00 pm. You do not need an appointment for the interview!  °[facility] [address], [place], [place] 336-631-2330   °[place] [facility]  336-342-8273   °[facility]  336-703-3100   °[facility]  336-570-6415   ° °Behavioral Health Resources in the Community: °Intensive Outpatient Programs °Organization         Address  Phone  Notes  °[facility] [address], [place], [place] 336-878-6098   °[facility] [address], [place], [place] 336-832-9800   °[facility] [address], [place], [place] ° 336-882-2125   °[facility] [address],  °[place], [place] 1-800-853-5163 or 336-641-4981   °Substance Abuse Resources °Organization         Address  Phone  Notes  °[facility]  336-882-2125   °[facility]  336-784-9470   °[facility]  336-285-9073   °[facility]  336-845-3988   °Residential & Outpatient Substance Abuse Program  1-800-659-3381   °Psychological Services °Organization         Address  Phone  Notes  °[facility]  336- 832-9600   °[facility]  336- 378-7881   °[facility] [address], [place] 1-800-853-5163 or 336-641-4981   ° °Mobile Crisis Teams °Organization         Address  Phone  Notes  °[facility], Mobile Crisis Care Unit  1-877-626-1772   °Assertive °[facility] ° [address]  [place], [place] 336-834-9664   °Sharon DeEsch [address] ° [place] 336-554-5454   ° °Self-Help/Support Groups °Organization         Address  Phone             Notes  °[facility]  - variety of support groups  336- 373-1402 Call for more information  °Narcotics Anonymous (NA), [facility] [address], °[place]   2 meetings at this location  ° °  Residential Treatment Programs °Organization         Address  Phone  Notes  °[facility] [address],    °[place]  1-866-801-8205   °[facility] ° [address], [place], [place] 704-293-8524   °[facility] [address], [place] 336-845-3988 Admissions: 8am-3pm M-F  °[facility] [address]    °[place], [place] 336-841-1104   °[facility] [address], [place], [place] 336-379-7146   °[facility] [address]  °[place], [place] 336-285-9073   °[facility] Intensive Outpatient [address], [place], [place] 336-852-3033   °[facility] [address]  °[place], [place] 1-877-615-2722 or 336-784-9470   °[facility] [address] [place], [place] 336-227-7417 Accepts Medicaid  °[facility] [address]  °[place] 1-800-659-3381 Substance Abuse/Addiction Treatment  ° °[place] Behavioral Health Resources °Organization         Address  Phone  Notes  °[facility]  (888) 581-9988   °Julie Brannon, PhD [address] [place], [place]   (336) 349-5553 or (336) 951-0000   °[facility]   [address] °[place], [place] (336) 349-4454   °[facility] [address], [place], [place] (336) 342-8316 Insurance/Medicaid/sponsorship through [facility]  °[facility] [address] [place]                                    [place], [place] (336) 342-8316 Therapy/tele-psych/case    °[facility] [address]  ° [place], [place] (336) 349-2233    °Dr. Arfeen  (336) 349-4544   °[facility] [place]  [employer] [place] [facility] 1) [address], [place] °2) [address], [place] °3)  [address], [place] (336) 349-3220 °(336) 342-7768 ° °(336) 342-8140   °[place] [facility] (336) 342-1394 or (336) 342-3537 (After Hours)    ° ° ° °Take your usual prescriptions as previously directed.  Call your regular medical doctor tomorrow to schedule a follow up appointment within the next 2 days. Return to the Emergency Department immediately sooner if worsening.  ° °

## 2013-08-27 NOTE — ED Notes (Signed)
Patient had unwitnessed fall at [facility], reported to EMS by staff that patient is acting her normal, she is in the alz unit.  Reported bump on the back of head.  Patient able to scoot around at nursing home and has no complaints at this time. Reported Gluc 122

## 2013-08-27 NOTE — ED Notes (Signed)
Patient transported to CT 

## 2013-09-20 ENCOUNTER — Emergency Department (HOSPITAL_COMMUNITY)
Admission: EM | Admit: 2013-09-20 | Discharge: 2013-09-20 | Disposition: A | Discharge status: Skilled Nursing Facility | Payer: Medicare Other | Attending: Emergency Medicine | Admitting: Emergency Medicine

## 2013-09-20 ENCOUNTER — Emergency Department (HOSPITAL_COMMUNITY): Payer: Medicare Other

## 2013-09-20 ENCOUNTER — Encounter (HOSPITAL_COMMUNITY): Payer: Self-pay | Admitting: Emergency Medicine

## 2013-09-20 DIAGNOSIS — Z79899 Other long term (current) drug therapy: Secondary | ICD-10-CM | POA: Insufficient documentation

## 2013-09-20 DIAGNOSIS — M1990 Unspecified osteoarthritis, unspecified site: Secondary | ICD-10-CM | POA: Insufficient documentation

## 2013-09-20 DIAGNOSIS — IMO0002 Reserved for concepts with insufficient information to code with codable children: Secondary | ICD-10-CM | POA: Insufficient documentation

## 2013-09-20 DIAGNOSIS — Z791 Long term (current) use of non-steroidal anti-inflammatories (NSAID): Secondary | ICD-10-CM | POA: Insufficient documentation

## 2013-09-20 DIAGNOSIS — Z87891 Personal history of nicotine dependence: Secondary | ICD-10-CM | POA: Insufficient documentation

## 2013-09-20 DIAGNOSIS — Y9389 Activity, other specified: Secondary | ICD-10-CM | POA: Insufficient documentation

## 2013-09-20 DIAGNOSIS — T148XXA Other injury of unspecified body region, initial encounter: Secondary | ICD-10-CM

## 2013-09-20 DIAGNOSIS — Z880 Allergy status to penicillin: Secondary | ICD-10-CM | POA: Insufficient documentation

## 2013-09-20 DIAGNOSIS — Z860100 Personal history of colon polyps, unspecified: Secondary | ICD-10-CM | POA: Insufficient documentation

## 2013-09-20 DIAGNOSIS — H9190 Unspecified hearing loss, unspecified ear: Secondary | ICD-10-CM | POA: Insufficient documentation

## 2013-09-20 DIAGNOSIS — Z8781 Personal history of (healed) traumatic fracture: Secondary | ICD-10-CM | POA: Insufficient documentation

## 2013-09-20 DIAGNOSIS — Y929 Unspecified place or not applicable: Secondary | ICD-10-CM | POA: Insufficient documentation

## 2013-09-20 DIAGNOSIS — F0390 Unspecified dementia without behavioral disturbance: Secondary | ICD-10-CM | POA: Insufficient documentation

## 2013-09-20 DIAGNOSIS — I10 Essential (primary) hypertension: Secondary | ICD-10-CM | POA: Insufficient documentation

## 2013-09-20 DIAGNOSIS — W19XXXA Unspecified fall, initial encounter: Secondary | ICD-10-CM

## 2013-09-20 DIAGNOSIS — E039 Hypothyroidism, unspecified: Secondary | ICD-10-CM | POA: Insufficient documentation

## 2013-09-20 DIAGNOSIS — G4700 Insomnia, unspecified: Secondary | ICD-10-CM | POA: Insufficient documentation

## 2013-09-20 DIAGNOSIS — W07XXXA Fall from chair, initial encounter: Secondary | ICD-10-CM | POA: Insufficient documentation

## 2013-09-20 LAB — URINALYSIS, ROUTINE W REFLEX MICROSCOPIC
BILIRUBIN URINE: NEGATIVE
Glucose, UA: NEGATIVE mg/dL
HGB URINE DIPSTICK: NEGATIVE
KETONES UR: NEGATIVE mg/dL
Leukocytes, UA: NEGATIVE
Nitrite: NEGATIVE
Protein, ur: NEGATIVE mg/dL
SPECIFIC GRAVITY, URINE: 1.024 (ref 1.005–1.030)
UROBILINOGEN UA: 4 mg/dL — AB (ref 0.0–1.0)
pH: 6.5 (ref 5.0–8.0)

## 2013-09-20 NOTE — ED Notes (Signed)
[facility] was called and notified of pt's need for transport back to [facility].

## 2013-09-20 NOTE — Progress Notes (Signed)
CSW met with patient and husband at bedside to complete this assessment.  Patient alert and oriented to self, patient acknowledged CSW by eye contact, and patient would only speak to her husband.  Patient would not answer questions asked but make random comments to her husband.  Husband reports that at this time they are not aware of the physician's course of treatment but their intent is for her to return to [facility].      Chesley Noon, MSW, [place], 09/20/2013 Evening Clinical Social Worker [phone]

## 2013-09-20 NOTE — ED Notes (Signed)
Bed: WA08 Expected date:  Expected time:  Means of arrival:  Comments: ems 

## 2013-09-20 NOTE — ED Notes (Signed)
Per EMS, pt fell at nursing home. Pt was sitting in her wheel chair when she fell, hitting her head on another pt's wheelchair footrest. Staff deny pt losing conciousness. Pt not on blood thinners. Staff state pt is at baseline mental status. Pt from [facility]

## 2013-09-20 NOTE — ED Provider Notes (Signed)
CSN: [id number]     Arrival date & time 09/20/13  1734 History   First MD Initiated Contact with Patient 09/20/13 1742     Chief Complaint  Patient presents with  . Fall     (Consider location/radiation/quality/duration/timing/severity/associated sxs/prior Treatment) HPI  This is a 80 year old female who presents from her memory care unit following a reported fall. Patient presents by EMS. Per report, patient fell out of her wheelchair hitting her head on another patient's foot rest. She did not lose consciousness. She's not currently on any anticoagulation or blood thinners. Per status at the nursing facility, patient is at her baseline. She has dementia and at baseline is disoriented. She is noncontributory to history taking.  Level V caveat for dementia  Past Medical History  Diagnosis Date  . Colon polyps   . Hyperlipidemia   . Hypertension   . Hypothyroidism   . Osteoporosis   . Dementia   . Insomnia   . Hearing deficit     Hearing aid, right   Past Surgical History  Procedure Laterality Date  . Ankle fracture surgery     Family History  Problem Relation Age of Onset  . Dementia Mother   . Cancer - Prostate Brother   . Stroke Father    History  Substance Use Topics  . Smoking status: Former Smoker    Types: Cigarettes    Quit date: 12/16/1990  . Smokeless tobacco: Former NeurosurgeonUser    Quit date: 07/15/1987  . Alcohol Use: 4.0 oz/week    8 drink(s) per week     Comment: 1-2 glasses daily   OB History   Grav Para Term Preterm Abortions TAB SAB Ect Mult Living                 Review of Systems  Unable to perform ROS: Dementia      Allergies  Amoxicillin and Penicillins  Home Medications   Prior to Admission medications   Medication Sig Start Date End Date Taking? Authorizing Provider  ALPRAZolam (XANAX) 0.25 MG tablet Take 0.25 mg by mouth 3 (three) times daily as needed for anxiety.   Yes Historical Provider, MD  clotrimazole-betamethasone (LOTRISONE)  cream Apply 1 application topically 2 (two) times daily. Apply to buttocks and perineum   Yes Historical Provider, MD  divalproex (DEPAKOTE SPRINKLE) 125 MG capsule Take 500 mg by mouth 2 (two) times daily. Takes 4 caps around lunch and 4 caps around supper time, each day   Yes Historical Provider, MD  docusate sodium (COLACE) 100 MG capsule Take 100 mg by mouth 2 (two) times daily.   Yes Historical Provider, MD  furosemide (LASIX) 20 MG tablet Take 20 mg by mouth daily.   Yes Historical Provider, MD  lactobacillus [facility](FLORANEX) CHEW chewable tablet Chew 1 tablet by mouth 3 (three) times daily with meals.   Yes Historical Provider, MD  levothyroxine (SYNTHROID, LEVOTHROID) 100 MCG tablet Take 100 mcg by mouth at bedtime.   Yes Historical Provider, MD  lisinopril (PRINIVIL,ZESTRIL) 5 MG tablet Take 5 mg by mouth daily.   Yes Historical Provider, MD  naproxen sodium (ANAPROX) 220 MG tablet Take 220 mg by mouth 2 (two) times daily as needed (pain.).   Yes Historical Provider, MD  traZODone (DESYREL) 50 MG tablet Take 75 mg by mouth at bedtime.   Yes Historical Provider, MD   BP 125/80  Pulse 70  Temp(Src) 97.9 F (36.6 C) (Oral)  Resp 20  SpO2 94% Physical Exam  Nursing note  and vitals reviewed. Constitutional:  Elderly  HENT:  Head: Normocephalic.  Mouth/Throat: Oropharynx is clear and moist.  Abrasion noted over left eyebrow  Eyes: EOM are normal. Pupils are equal, round, and reactive to light.  Neck: Neck supple.  Cardiovascular: Normal rate, regular rhythm and normal heart sounds.   No murmur heard. Pulmonary/Chest: Effort normal and breath sounds normal. No respiratory distress. She has no wheezes.  Abdominal: Soft. Bowel sounds are normal. There is no tenderness. There is no rebound.  Musculoskeletal:  Full range of motion at the knee and the hip bilaterally  Neurological: She is alert.  Moves all 4 extremities  Skin: Skin is warm and dry.  Psychiatric: She has a normal mood and  affect.    ED Course  Procedures (including critical care time) Labs Review Labs Reviewed  URINALYSIS, ROUTINE W REFLEX MICROSCOPIC - Abnormal; Notable for the following:    APPearance CLOUDY (*)    Urobilinogen, UA 4.0 (*)    All other components within normal limits    Imaging Review Ct Head Wo Contrast  09/20/2013   CLINICAL DATA:  Fall from wheelchair.  Head injury.  EXAM: CT HEAD WITHOUT CONTRAST  CT CERVICAL SPINE WITHOUT CONTRAST  TECHNIQUE: Multidetector CT imaging of the head and cervical spine was performed following the standard protocol without intravenous contrast. Multiplanar CT image reconstructions of the cervical spine were also generated.  COMPARISON:  Prior studies 08/27/2013.  FINDINGS: CT HEAD FINDINGS  Atrophy and extensive confluent low-density in the periventricular and subcortical white matter are stable. There is no evidence of acute intracranial hemorrhage, mass lesion, brain edema or extra-axial fluid collection.  The visualized paranasal sinuses, mastoid air cells and middle ears are clear. The calvarium is intact.  CT CERVICAL SPINE FINDINGS  The cervical alignment is stable. There is no evidence of acute fracture or traumatic subluxation. There is stable multilevel spondylosis with disc space loss, uncinate spurring and facet disease. There is a stable degenerative grade 1 anterolisthesis at C7-T1 with asymmetric left-sided facet hypertrophy.  No acute soft tissue findings are evident. The lung apices are clear. There is asymmetric right sternoclavicular arthropathy.  IMPRESSION: 1. No acute intracranial, calvarial or cervical spine findings demonstrated. 2. Stable atrophy and extensive periventricular and subcortical white matter disease. 3. Stable multilevel cervical spondylosis and alignment.   Electronically Signed   By: Roxy Horseman M.D.   On: 09/20/2013 20:19   Ct Cervical Spine Wo Contrast  09/20/2013   CLINICAL DATA:  Fall from wheelchair.  Head injury.  EXAM:  CT HEAD WITHOUT CONTRAST  CT CERVICAL SPINE WITHOUT CONTRAST  TECHNIQUE: Multidetector CT imaging of the head and cervical spine was performed following the standard protocol without intravenous contrast. Multiplanar CT image reconstructions of the cervical spine were also generated.  COMPARISON:  Prior studies 08/27/2013.  FINDINGS: CT HEAD FINDINGS  Atrophy and extensive confluent low-density in the periventricular and subcortical white matter are stable. There is no evidence of acute intracranial hemorrhage, mass lesion, brain edema or extra-axial fluid collection.  The visualized paranasal sinuses, mastoid air cells and middle ears are clear. The calvarium is intact.  CT CERVICAL SPINE FINDINGS  The cervical alignment is stable. There is no evidence of acute fracture or traumatic subluxation. There is stable multilevel spondylosis with disc space loss, uncinate spurring and facet disease. There is a stable degenerative grade 1 anterolisthesis at C7-T1 with asymmetric left-sided facet hypertrophy.  No acute soft tissue findings are evident. The lung apices are clear.  There is asymmetric right sternoclavicular arthropathy.  IMPRESSION: 1. No acute intracranial, calvarial or cervical spine findings demonstrated. 2. Stable atrophy and extensive periventricular and subcortical white matter disease. 3. Stable multilevel cervical spondylosis and alignment.   Electronically Signed   By: Roxy Horseman M.D.   On: 09/20/2013 20:19     EKG Interpretation None      MDM   Final diagnoses:  Fall  Abrasion    Patient presents following a fall. She is reportedly at her baseline. Only signs of trauma include an abrasion to the left arm route. CT head and neck obtained and shows no evidence of acute intracranial abnormality. Urinalysis without evidence of UTI. Patient will be discharged back to her living facility.    Shon Baton, MD 09/20/13 2026

## 2013-09-20 NOTE — Discharge Instructions (Signed)
Abrasion °An abrasion is a cut or scrape of the skin. Abrasions do not extend through all layers of the skin and most heal within 10 days. It is important to care for your abrasion properly to prevent infection. °CAUSES  °Most abrasions are caused by falling on, or gliding across, the ground or other surface. When your skin rubs on something, the outer and inner layer of skin rubs off, causing an abrasion. °DIAGNOSIS  °Your caregiver will be able to diagnose an abrasion during a physical exam.  °TREATMENT  °Your treatment depends on how large and deep the abrasion is. Generally, your abrasion will be cleaned with water and a mild soap to remove any dirt or debris. An antibiotic ointment may be put over the abrasion to prevent an infection. A bandage (dressing) may be wrapped around the abrasion to keep it from getting dirty.  °You may need a tetanus shot if: °· You cannot remember when you had your last tetanus shot. °· You have never had a tetanus shot. °· The injury broke your skin. °If you get a tetanus shot, your arm may swell, get red, and feel warm to the touch. This is common and not a problem. If you need a tetanus shot and you choose not to have one, there is a rare chance of getting tetanus. Sickness from tetanus can be serious.  °HOME CARE INSTRUCTIONS  °· If a dressing was applied, change it at least once a day or as directed by your caregiver. If the bandage sticks, soak it off with warm water.   °· Wash the area with water and a mild soap to remove all the ointment 2 times a day. Rinse off the soap and pat the area dry with a clean towel.   °· Reapply any ointment as directed by your caregiver. This will help prevent infection and keep the bandage from sticking. Use gauze over the wound and under the dressing to help keep the bandage from sticking.   °· Change your dressing right away if it becomes wet or dirty.   °· Only take over-the-counter or prescription medicines for pain, discomfort, or fever as  directed by your caregiver.   °· Follow up with your caregiver within 24 48 hours for a wound check, or as directed. If you were not given a wound-check appointment, look closely at your abrasion for redness, swelling, or pus. These are signs of infection. °SEEK IMMEDIATE MEDICAL CARE IF:  °· You have increasing pain in the wound.   °· You have redness, swelling, or tenderness around the wound.   °· You have pus coming from the wound.   °· You have a fever or persistent symptoms for more than 2 3 days. °· You have a fever and your symptoms suddenly get worse. °· You have a bad smell coming from the wound or dressing.   °MAKE SURE YOU:  °· Understand these instructions. °· Will watch your condition. °· Will get help right away if you are not doing well or get worse. °Document Released: 01/21/2005 Document Revised: 03/30/2012 Document Reviewed: 03/17/2011 °Exit[employer]® Patient Information ©2014 [employer]. ° °

## 2013-09-23 ENCOUNTER — Emergency Department (HOSPITAL_COMMUNITY)
Admission: EM | Admit: 2013-09-23 | Discharge: 2013-09-24 | Disposition: A | Discharge status: Home / Self Care | Payer: Medicare Other | Attending: Emergency Medicine | Admitting: Emergency Medicine

## 2013-09-23 ENCOUNTER — Encounter (HOSPITAL_COMMUNITY): Payer: Self-pay | Admitting: Emergency Medicine

## 2013-09-23 DIAGNOSIS — Z791 Long term (current) use of non-steroidal anti-inflammatories (NSAID): Secondary | ICD-10-CM | POA: Insufficient documentation

## 2013-09-23 DIAGNOSIS — H9190 Unspecified hearing loss, unspecified ear: Secondary | ICD-10-CM | POA: Insufficient documentation

## 2013-09-23 DIAGNOSIS — Z860100 Personal history of colon polyps, unspecified: Secondary | ICD-10-CM | POA: Insufficient documentation

## 2013-09-23 DIAGNOSIS — F068 Other specified mental disorders due to known physiological condition: Secondary | ICD-10-CM

## 2013-09-23 DIAGNOSIS — W19XXXA Unspecified fall, initial encounter: Secondary | ICD-10-CM

## 2013-09-23 DIAGNOSIS — Y9389 Activity, other specified: Secondary | ICD-10-CM | POA: Insufficient documentation

## 2013-09-23 DIAGNOSIS — I10 Essential (primary) hypertension: Secondary | ICD-10-CM | POA: Insufficient documentation

## 2013-09-23 DIAGNOSIS — Z9181 History of falling: Secondary | ICD-10-CM | POA: Insufficient documentation

## 2013-09-23 DIAGNOSIS — Z87828 Personal history of other (healed) physical injury and trauma: Secondary | ICD-10-CM | POA: Insufficient documentation

## 2013-09-23 DIAGNOSIS — Z880 Allergy status to penicillin: Secondary | ICD-10-CM | POA: Insufficient documentation

## 2013-09-23 DIAGNOSIS — Z79899 Other long term (current) drug therapy: Secondary | ICD-10-CM | POA: Insufficient documentation

## 2013-09-23 DIAGNOSIS — E039 Hypothyroidism, unspecified: Secondary | ICD-10-CM | POA: Insufficient documentation

## 2013-09-23 DIAGNOSIS — Z043 Encounter for examination and observation following other accident: Secondary | ICD-10-CM | POA: Insufficient documentation

## 2013-09-23 DIAGNOSIS — Y9210 Unspecified residential institution as the place of occurrence of the external cause: Secondary | ICD-10-CM | POA: Insufficient documentation

## 2013-09-23 DIAGNOSIS — R296 Repeated falls: Secondary | ICD-10-CM | POA: Insufficient documentation

## 2013-09-23 DIAGNOSIS — Z8739 Personal history of other diseases of the musculoskeletal system and connective tissue: Secondary | ICD-10-CM | POA: Insufficient documentation

## 2013-09-23 DIAGNOSIS — G4700 Insomnia, unspecified: Secondary | ICD-10-CM | POA: Insufficient documentation

## 2013-09-23 DIAGNOSIS — Y92129 Unspecified place in nursing home as the place of occurrence of the external cause: Secondary | ICD-10-CM

## 2013-09-23 DIAGNOSIS — F0390 Unspecified dementia without behavioral disturbance: Secondary | ICD-10-CM | POA: Insufficient documentation

## 2013-09-23 DIAGNOSIS — Z789 Other specified health status: Secondary | ICD-10-CM | POA: Insufficient documentation

## 2013-09-23 DIAGNOSIS — Z87891 Personal history of nicotine dependence: Secondary | ICD-10-CM | POA: Insufficient documentation

## 2013-09-23 NOTE — ED Notes (Signed)
Patient from [facility]. Patient was found down on side of bed chest up was under bed. Fall was un witnessed. Patient has hx of dementia and has fallen recently. Bruise to left eye from last fall.

## 2013-09-23 NOTE — ED Notes (Signed)
Report called to Malo at [facility]

## 2013-09-23 NOTE — Discharge Instructions (Signed)
Fall Prevention in Hospitals  As a hospital patient, your condition and the treatments you receive can increase your risk for falls. Some additional risk factors for falls in a hospital include:   Being in an unfamiliar environment.   Being on bed rest.   Your surgery.   Taking certain medicines.   Your tubing requirements, such as intravenous (IV) therapy or catheters.  It is important that you learn how to decrease fall risks while at the hospital. Below are important tips that can help prevent falls.  SAFETY TIPS FOR PREVENTING FALLS  Talk about your risk of falling.   Ask your caregiver why you are at risk for falling. Is it your medicine, illness, tubing placement, or something else?   Make a plan with your caregiver to keep you safe from falls.   Ask your caregiver or pharmacist about side effect of your medicines. Some medicines can make you dizzy or affect your coordination.  Ask for help.   Ask for help before getting out of bed. You may need to press your call button.   Ask for assistance in getting you safely to the toilet.   Ask for a walker or cane to be put at your bedside. Ask that most of the side rails on your bed be placed up before your caregiver leaves the room.   Ask family or friends to sit with you.   Ask for things that are out of your reach, such as your glasses, hearing aids, telephone, bedside table, or call button.  Follow these tips to avoid falling:   Stay lying or seated, rather than standing, while waiting for help.   Wear rubber-soled slippers or shoes whenever you walk in the hospital.   Avoid quick, sudden movements.   Change positions slowly.   Sit on the side of your bed before standing.   Stand up slowly and wait before you start to walk.   Let your caregiver know if there is a spill on the floor.   Pay careful attention to the medical equipment, electrical cords, and tubes around you.   When you need help, use your call button by your bed or in the  bathroom. Wait for one of your caregivers to help you.   If you feel dizzy or unsure of your footing, return to bed and wait for assistance.   Avoid being distracted by the TV, telephone, or another person in your room.   Do not lean or support yourself on rolling objects, such as IV poles or bedside tables.  Document Released: 04/10/2000 Document Revised: 03/30/2012 Document Reviewed: 12/20/2011  Exit[employer] Patient Information 2014 [employer].

## 2013-09-23 NOTE — ED Provider Notes (Signed)
CSN: [id number]     Arrival date & time 09/23/13  2215 History   First MD Initiated Contact with Patient 09/23/13 2254     Chief Complaint  Patient presents with  . Fall     (Consider location/radiation/quality/duration/timing/severity/associated sxs/prior Treatment) HPI 80 year old female presents to emergency room from the nursing facility via EMS after being found on the floor under her bed.  Is suspected that she fell.  She has history of frequent falls.  Patient was seen 3 days ago for fall, had negative CT scans done on that time.  Patient still has bruising around her left eye and right hand.  Patient is essentially nonverbal, has severe dementia.  Family at the bedside reports that she is at her baseline. Past Medical History  Diagnosis Date  . Colon polyps   . Hyperlipidemia   . Hypertension   . Hypothyroidism   . Osteoporosis   . Dementia   . Insomnia   . Hearing deficit     Hearing aid, right   Past Surgical History  Procedure Laterality Date  . Ankle fracture surgery     Family History  Problem Relation Age of Onset  . Dementia Mother   . Cancer - Prostate Brother   . Stroke Father    History  Substance Use Topics  . Smoking status: Former Smoker    Types: Cigarettes    Quit date: 12/16/1990  . Smokeless tobacco: Former Neurosurgeon    Quit date: 07/15/1987  . Alcohol Use: 4.0 oz/week    8 drink(s) per week     Comment: 1-2 glasses daily   OB History   Grav Para Term Preterm Abortions TAB SAB Ect Mult Living                 Review of Systems  See History of Present Illness; otherwise all other systems are reviewed and negative   Allergies  Amoxicillin and Penicillins  Home Medications   Prior to Admission medications   Medication Sig Start Date End Date Taking? Authorizing Provider  ALPRAZolam (XANAX) 0.25 MG tablet Take 0.25 mg by mouth 3 (three) times daily as needed for anxiety.    Historical Provider, MD  clotrimazole-betamethasone (LOTRISONE)  cream Apply 1 application topically 2 (two) times daily. Apply to buttocks and perineum    Historical Provider, MD  divalproex (DEPAKOTE SPRINKLE) 125 MG capsule Take 500 mg by mouth 2 (two) times daily. Takes 4 caps around lunch and 4 caps around supper time, each day    Historical Provider, MD  docusate sodium (COLACE) 100 MG capsule Take 100 mg by mouth 2 (two) times daily.    Historical Provider, MD  furosemide (LASIX) 20 MG tablet Take 20 mg by mouth daily.    Historical Provider, MD  lactobacillus [facility]) CHEW chewable tablet Chew 1 tablet by mouth 3 (three) times daily with meals.    Historical Provider, MD  levothyroxine (SYNTHROID, LEVOTHROID) 100 MCG tablet Take 100 mcg by mouth at bedtime.    Historical Provider, MD  lisinopril (PRINIVIL,ZESTRIL) 5 MG tablet Take 5 mg by mouth daily.    Historical Provider, MD  naproxen sodium (ANAPROX) 220 MG tablet Take 220 mg by mouth 2 (two) times daily as needed (pain.).    Historical Provider, MD  traZODone (DESYREL) 50 MG tablet Take 75 mg by mouth at bedtime.    Historical Provider, MD   BP 104/76  Pulse 74  Temp(Src) 97.9 F (36.6 C) (Oral)  Resp 20  SpO2 96%  Physical Exam  Nursing note and vitals reviewed. Constitutional: She is oriented to person, place, and time. She appears well-developed and well-nourished. No distress.  HENT:  Head: Normocephalic and atraumatic.  Right Ear: External ear normal.  Left Ear: External ear normal.  Nose: Nose normal.  Mouth/Throat: Oropharynx is clear and moist.  Eyes: Conjunctivae and EOM are normal. Pupils are equal, round, and reactive to light.  Neck: Normal range of motion. Neck supple. No JVD present. No tracheal deviation present. No thyromegaly present.  C-collar in place.  Removed.  No step-off crepitus or deformity.  No pain with movement.  Patient moving neck at her baseline.  Family reports at her baseline she is very stiff.  Cardiovascular: Normal rate, regular rhythm, normal heart  sounds and intact distal pulses.  Exam reveals no gallop and no friction rub.   No murmur heard. Pulmonary/Chest: Effort normal and breath sounds normal. No stridor. No respiratory distress. She has no wheezes. She has no rales. She exhibits no tenderness.  Abdominal: Soft. Bowel sounds are normal. She exhibits no distension and no mass. There is no tenderness. There is no rebound and no guarding.  Musculoskeletal: She exhibits no edema and no tenderness.  Patient has mild contracture of upper extremities.  She resists movement.  No bruising, trauma noted.  No crepitus no deformity no obvious pain with palpation or movement of all extremities  Lymphadenopathy:    She has no cervical adenopathy.  Neurological: She is alert and oriented to person, place, and time. She has normal reflexes. No cranial nerve deficit. She exhibits normal muscle tone. Coordination normal.  Skin: Skin is warm and dry. No rash noted. She is not diaphoretic. No erythema. No pallor.  Psychiatric: She has a normal mood and affect. Her behavior is normal. Judgment and thought content normal.    ED Course  Procedures (including critical care time) Labs Review Labs Reviewed - No data to display  Imaging Review No results found.   EKG Interpretation None      MDM   Final diagnoses:  Fall at nursing home  DEMENTIA    80 year old female status post fall.  Had discussion with family at the bedside.  Patient does not have any obvious trauma.  No elicit pain on exam.  Patient is DO NOT RESUSCITATE, has severe dementia.  If patient was found to have significant injury, and they would opt not to have surgery.  At this time we will monitor her at her nursing facility for symptoms.  Family is comfortable without any imaging.  Will discharge home.    Olivia Mackielga M Kelson Queenan, MD 09/23/13 2330

## 2015-03-02 IMAGING — CT CT HEAD W/O CM
3 of 6 series · 15 of 30 positions shown, 17 images · non-contrast
Comparison: Prior studies 08/27/2013.

CLINICAL DATA: Fall from wheelchair.  Head injury.

EXAM:
CT HEAD WITHOUT CONTRAST
CT CERVICAL SPINE WITHOUT CONTRAST
TECHNIQUE: Multidetector CT imaging of the head and cervical spine was
performed following the standard protocol without intravenous
contrast. Multiplanar CT image reconstructions of the cervical spine
were also generated.

[Series 4: c-spine st · axial · 0.38mm/px · z∈[+1070,+1166]mm · 5 of 85 slices shown]
[im 13/85  brain]
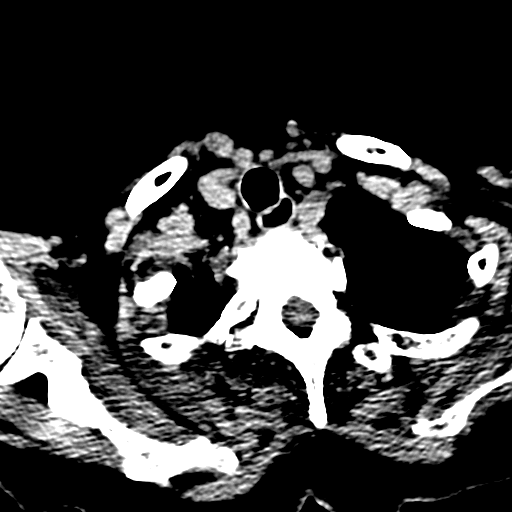
[im 25/85  brain]
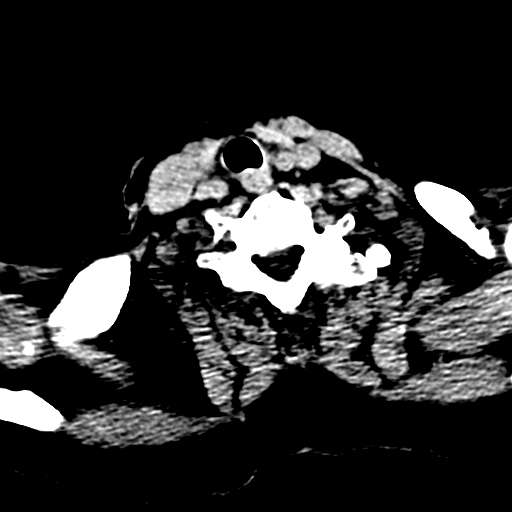
[im 37/85  brain]
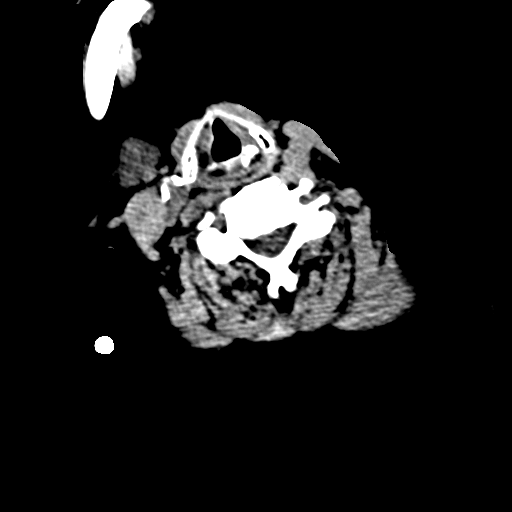
[im 49/85  brain]
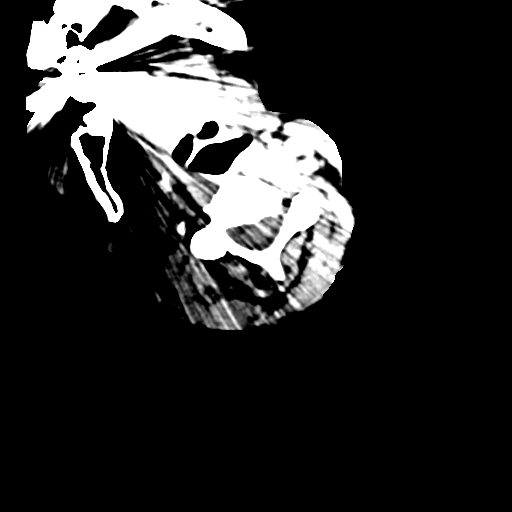
[im 61/85  brain]
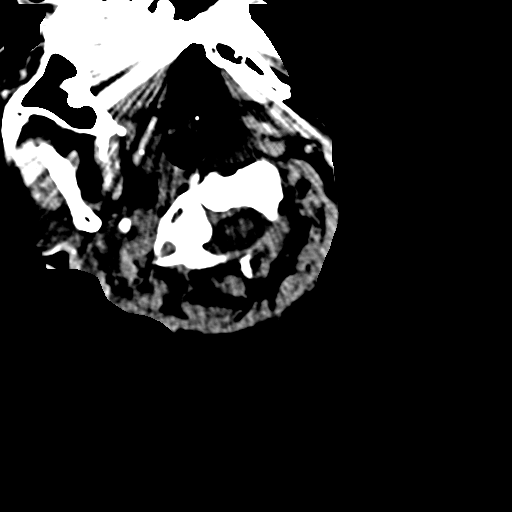

[Series 5: bone windows · axial · 0.43mm/px · z∈[+1222,+1294]mm · 3 of 49 slices shown]
[im 13/49  bone]
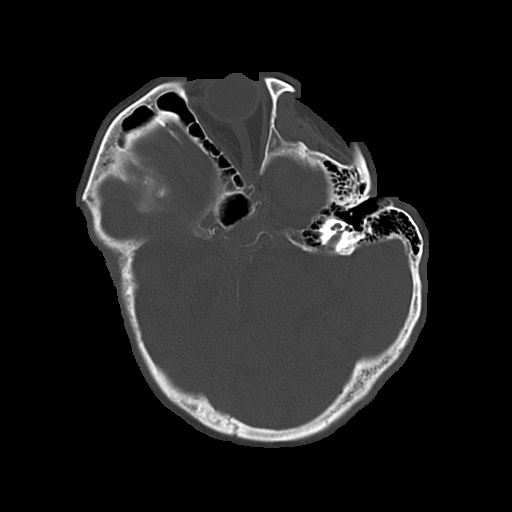
[im 25/49  bone]
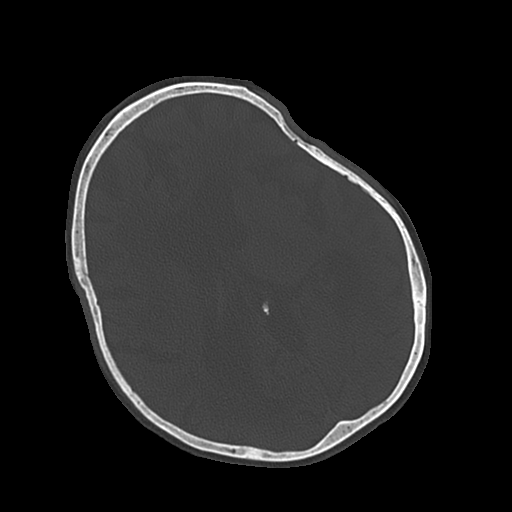
[im 37/49  bone]
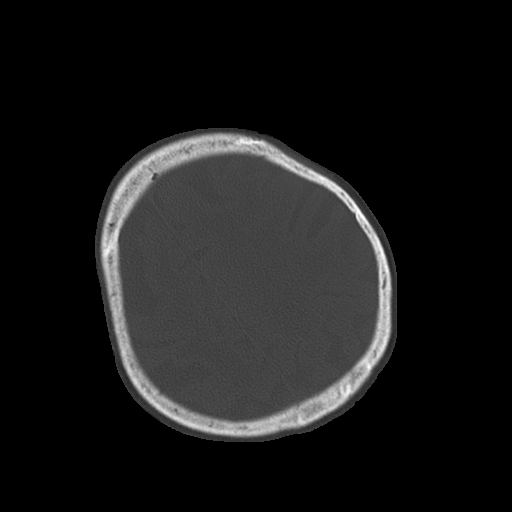

[Series 9: axial recon · axial · 0.29mm/px · z∈[+1012,+1137]mm · 7 of 92 slices shown, 9 images]
[im 12/92  brain]
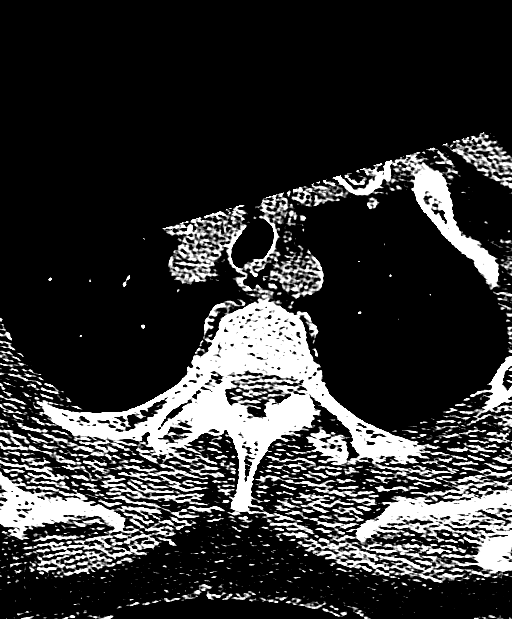
[im 12/92  bone]
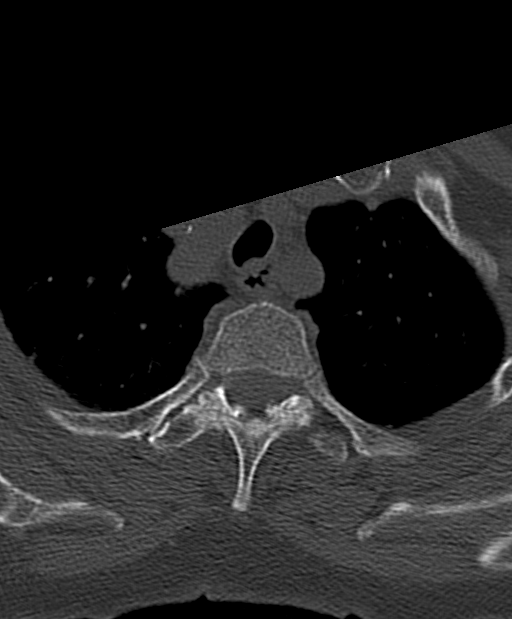
[im 23/92  brain]
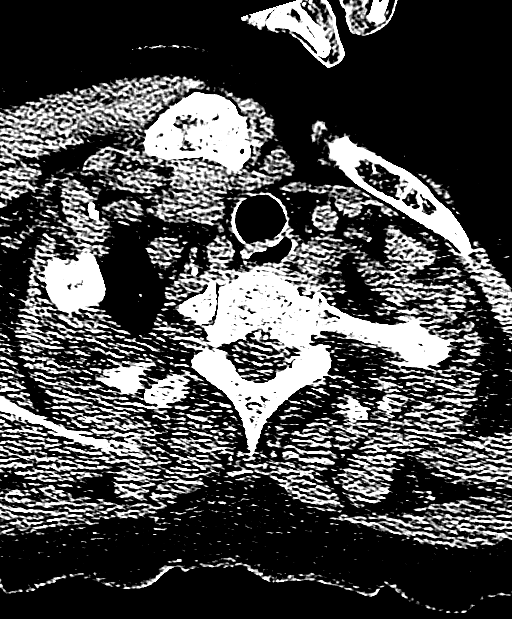
[im 35/92  brain]
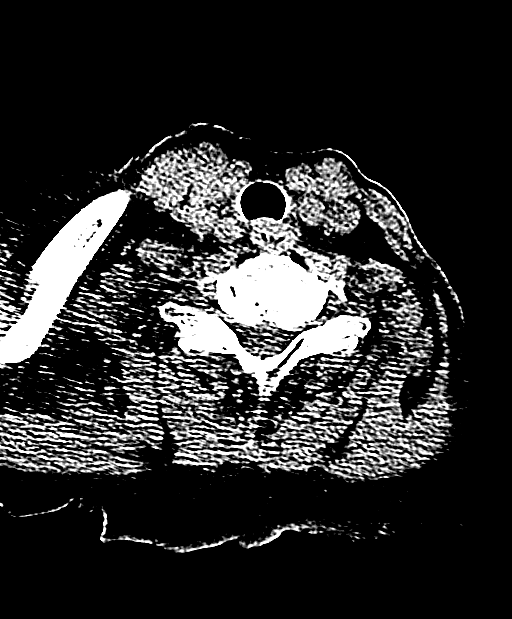
[im 46/92  brain]
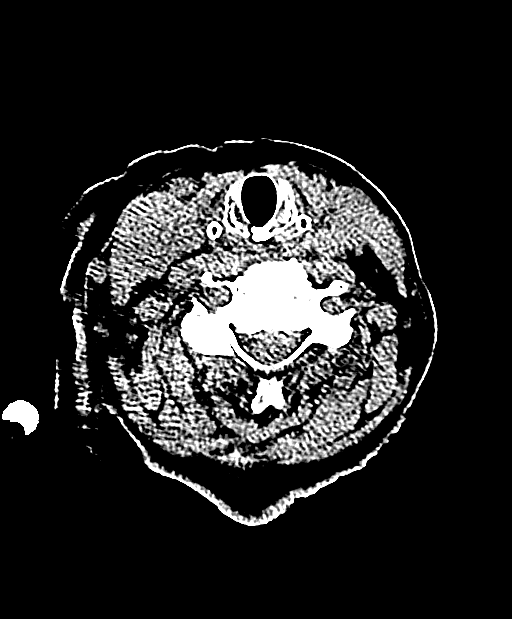
[im 57/92  brain]
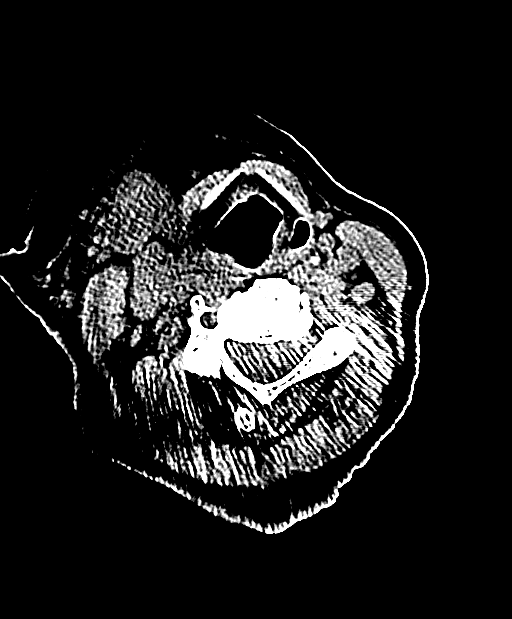
[im 57/92  bone]
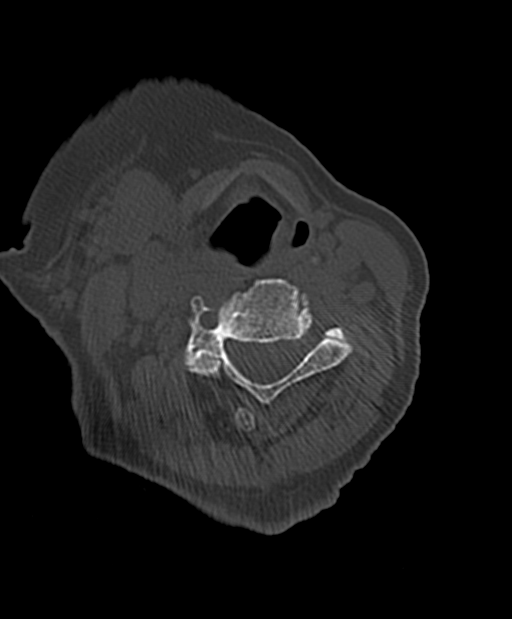
[im 69/92  brain]
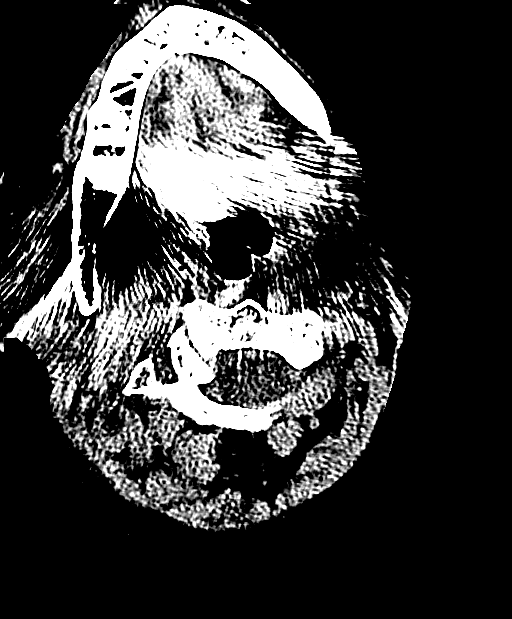
[im 80/92  brain]
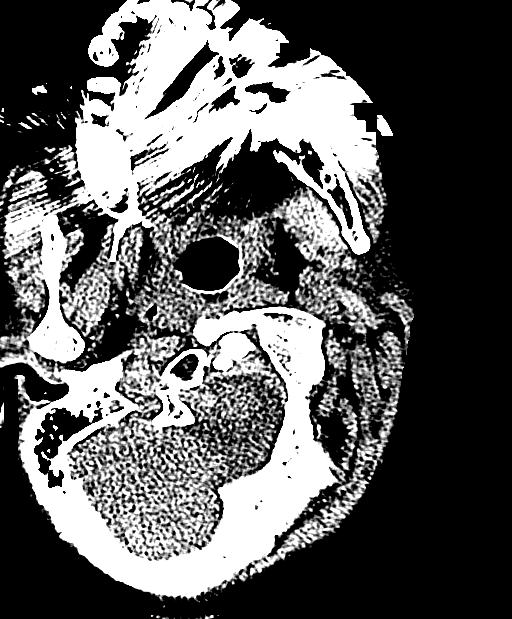

[15 of 30 positions shown; findings below may reference images not displayed]

FINDINGS: CT HEAD FINDINGS

Atrophy and extensive confluent low-density in the periventricular
and subcortical white matter are stable. There is no evidence of
acute intracranial hemorrhage, mass lesion, brain edema or
extra-axial fluid collection.

The visualized paranasal sinuses, mastoid air cells and middle ears
are clear. The calvarium is intact.

CT CERVICAL SPINE FINDINGS

The cervical alignment is stable. There is no evidence of acute
fracture or traumatic subluxation. There is stable multilevel
spondylosis with disc space loss, uncinate spurring and facet
disease. There is a stable degenerative grade 1 anterolisthesis at
C7-T1 with asymmetric left-sided facet hypertrophy.

No acute soft tissue findings are evident. The lung apices are
clear. There is asymmetric right sternoclavicular arthropathy.
IMPRESSION: 1. No acute intracranial, calvarial or cervical spine findings
demonstrated.
2. Stable atrophy and extensive periventricular and subcortical
white matter disease.
3. Stable multilevel cervical spondylosis and alignment.

## 2015-04-28 DEATH — deceased
